# Patient Record
Sex: Female | Born: 1990 | Race: Black or African American | Hispanic: No | Marital: Single | Smoking: Never smoker
Health system: Southern US, Community
[De-identification: ages and names within clinical notes are randomized; demographics above are authoritative.]

## PROBLEM LIST (undated history)

## (undated) ENCOUNTER — Inpatient Hospital Stay (HOSPITAL_COMMUNITY): Payer: Self-pay

## (undated) DIAGNOSIS — L509 Urticaria, unspecified: Secondary | ICD-10-CM

## (undated) DIAGNOSIS — B009 Herpesviral infection, unspecified: Secondary | ICD-10-CM

## (undated) DIAGNOSIS — F32A Depression, unspecified: Secondary | ICD-10-CM

## (undated) DIAGNOSIS — R519 Headache, unspecified: Secondary | ICD-10-CM

## (undated) DIAGNOSIS — R51 Headache: Secondary | ICD-10-CM

## (undated) DIAGNOSIS — B999 Unspecified infectious disease: Secondary | ICD-10-CM

## (undated) DIAGNOSIS — N946 Dysmenorrhea, unspecified: Secondary | ICD-10-CM

## (undated) DIAGNOSIS — T783XXA Angioneurotic edema, initial encounter: Secondary | ICD-10-CM

## (undated) DIAGNOSIS — A64 Unspecified sexually transmitted disease: Secondary | ICD-10-CM

## (undated) DIAGNOSIS — F329 Major depressive disorder, single episode, unspecified: Secondary | ICD-10-CM

## (undated) HISTORY — DX: Unspecified sexually transmitted disease: A64

## (undated) HISTORY — DX: Angioneurotic edema, initial encounter: T78.3XXA

## (undated) HISTORY — DX: Dysmenorrhea, unspecified: N94.6

## (undated) HISTORY — DX: Urticaria, unspecified: L50.9

## (undated) HISTORY — DX: Depression, unspecified: F32.A

## (undated) HISTORY — PX: MULTIPLE TOOTH EXTRACTIONS: SHX2053

---
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
## 2014-04-09 ENCOUNTER — Encounter (HOSPITAL_COMMUNITY): Payer: Self-pay | Admitting: Emergency Medicine

## 2014-04-09 ENCOUNTER — Emergency Department (HOSPITAL_COMMUNITY): Payer: Self-pay

## 2014-04-09 ENCOUNTER — Emergency Department (HOSPITAL_COMMUNITY)
Admission: EM | Admit: 2014-04-09 | Discharge: 2014-04-09 | Disposition: A | Discharge status: Home / Self Care | Payer: Self-pay | Attending: Emergency Medicine | Admitting: Emergency Medicine

## 2014-04-09 DIAGNOSIS — R0789 Other chest pain: Secondary | ICD-10-CM | POA: Insufficient documentation

## 2014-04-09 DIAGNOSIS — R079 Chest pain, unspecified: Secondary | ICD-10-CM

## 2014-04-09 DIAGNOSIS — R5381 Other malaise: Secondary | ICD-10-CM | POA: Insufficient documentation

## 2014-04-09 DIAGNOSIS — Z8679 Personal history of other diseases of the circulatory system: Secondary | ICD-10-CM | POA: Insufficient documentation

## 2014-04-09 DIAGNOSIS — R5383 Other fatigue: Secondary | ICD-10-CM

## 2014-04-09 LAB — CBC WITH DIFFERENTIAL/PLATELET
BASOS ABS: 0.1 10*3/uL (ref 0.0–0.1)
BASOS PCT: 1 % (ref 0–1)
Eosinophils Absolute: 0.6 10*3/uL (ref 0.0–0.7)
Eosinophils Relative: 6 % — ABNORMAL HIGH (ref 0–5)
HCT: 40.1 % (ref 36.0–46.0)
Hemoglobin: 13.4 g/dL (ref 12.0–15.0)
LYMPHS PCT: 42 % (ref 12–46)
Lymphs Abs: 3.7 10*3/uL (ref 0.7–4.0)
MCH: 29.8 pg (ref 26.0–34.0)
MCHC: 33.4 g/dL (ref 30.0–36.0)
MCV: 89.3 fL (ref 78.0–100.0)
Monocytes Absolute: 0.7 10*3/uL (ref 0.1–1.0)
Monocytes Relative: 8 % (ref 3–12)
NEUTROS ABS: 3.8 10*3/uL (ref 1.7–7.7)
NEUTROS PCT: 43 % (ref 43–77)
PLATELETS: 341 10*3/uL (ref 150–400)
RBC: 4.49 MIL/uL (ref 3.87–5.11)
RDW: 13 % (ref 11.5–15.5)
WBC: 8.8 10*3/uL (ref 4.0–10.5)

## 2014-04-09 LAB — I-STAT TROPONIN, ED: Troponin i, poc: 0 ng/mL (ref 0.00–0.08)

## 2014-04-09 LAB — I-STAT CHEM 8, ED
BUN: 11 mg/dL (ref 6–23)
CALCIUM ION: 1.16 mmol/L (ref 1.12–1.23)
Chloride: 107 mEq/L (ref 96–112)
Creatinine, Ser: 0.7 mg/dL (ref 0.50–1.10)
GLUCOSE: 91 mg/dL (ref 70–99)
HEMATOCRIT: 42 % (ref 36.0–46.0)
HEMOGLOBIN: 14.3 g/dL (ref 12.0–15.0)
Potassium: 3.8 mEq/L (ref 3.7–5.3)
Sodium: 135 mEq/L — ABNORMAL LOW (ref 137–147)
TCO2: 24 mmol/L (ref 0–100)

## 2014-04-09 LAB — D-DIMER, QUANTITATIVE (NOT AT ARMC)

## 2014-04-09 NOTE — ED Notes (Signed)
Pt states she has been having chest pain X 1 week to her L chest. No radiation. It hurts her when she presses on her chest and when she takes a deep breath. States she has a new birth control implant since April and that she only drinks sodas because she hates water".

## 2014-04-09 NOTE — ED Notes (Signed)
Pt in c/o chest pain that is intermittent, symptoms x1 week, pain to left breast area, denies n/v or shortness of breath, pain is worse when taking a deep breath, nasal congestion also

## 2014-04-09 NOTE — ED Provider Notes (Signed)
CSN: [id number]     Arrival date & time 04/09/14  1808 History   First MD Initiated Contact with Patient 04/09/14 2206     Chief Complaint  Patient presents with  . Chest Pain     (Consider location/radiation/quality/duration/timing/severity/associated sxs/prior Treatment) HPI Comments: This is a 23 year old who, reports, that she's been having stabbing, left-sided chest pain, intermittently/sporadically for the past, week.  It will suddenly stab her last several seconds and then go away spontaneously.  She denies any nausea, vomiting, diaphoresis, shortness of breath with this pain. She recently had an Implanon birth control implant in April that she does have a very strong history of early cardiac disease.  Patient is a 23 y.o. female presenting with chest pain. The history is provided by the patient.  Chest Pain Pain location:  L chest Pain quality: stabbing   Pain radiates to:  Does not radiate Pain radiates to the back: no   Pain severity:  Mild Onset quality:  Gradual Duration:  1 week Timing:  Sporadic Progression:  Unchanged Chronicity:  New Context: at rest   Context: not breathing, not eating, no intercourse, not lifting, no movement, not raising an arm and no trauma   Relieved by:  Nothing Worsened by:  Nothing tried Ineffective treatments:  None tried Associated symptoms: anxiety and fatigue   Associated symptoms: no back pain, no dizziness, no fever, no heartburn, no nausea, no numbness, no palpitations, no shortness of breath, not vomiting and no weakness   Risk factors: birth control   Risk factors: no aortic disease, no coronary artery disease, no diabetes mellitus, no Ehlers-Danlos syndrome, no high cholesterol, no hypertension, no immobilization, not female, no Marfan's syndrome, not obese, not pregnant, no prior DVT/PE, no smoking and no surgery     History reviewed. No pertinent past medical history. History reviewed. No pertinent past surgical history. History  reviewed. No pertinent family history. History  Substance Use Topics  . Smoking status: Never Smoker   . Smokeless tobacco: Not on file  . Alcohol Use: Not on file   OB History   Grav Para Term Preterm Abortions TAB SAB Ect Mult Living                 Review of Systems  Constitutional: Positive for fatigue. Negative for fever.  Respiratory: Negative for shortness of breath.   Cardiovascular: Positive for chest pain. Negative for palpitations and leg swelling.  Gastrointestinal: Negative for heartburn, nausea and vomiting.  Musculoskeletal: Negative for back pain and joint swelling.  Skin: Negative for rash and wound.  Neurological: Negative for dizziness, weakness and numbness.  All other systems reviewed and are negative.     Allergies  Review of patient's allergies indicates no known allergies.  Home Medications   Prior to Admission medications   Medication Sig Start Date End Date Taking? Authorizing Provider  etonogestrel (IMPLANON) 68 MG IMPL implant Inject 1 each into the skin once.   Yes Historical Provider, MD   BP 103/80  Pulse 101  Temp(Src) 98.1 F (36.7 C) (Oral)  Resp 11  Wt 117 lb (53.071 kg)  SpO2 83%  LMP 03/11/2014 Physical Exam  ED Course  Procedures (including critical care time) Labs Review Labs Reviewed  CBC WITH DIFFERENTIAL - Abnormal; Notable for the following:    Eosinophils Relative 6 (*)    All other components within normal limits  I-STAT CHEM 8, ED - Abnormal; Notable for the following:    Sodium 135 (*)  All other components within normal limits  D-DIMER, QUANTITATIVE  I-STAT TROPOININ, ED    Imaging Review Dg Chest 2 View  04/09/2014   CLINICAL DATA:  Chest pain.  EXAM: CHEST  2 VIEW  COMPARISON:  None.  FINDINGS: The heart size and mediastinal contours are within normal limits. Both lungs are clear. The visualized skeletal structures are unremarkable.  IMPRESSION: No active cardiopulmonary disease.   Electronically Signed    By: Charlett NoseKevin  Dover M.D.   On: 04/09/2014 19:28     EKG Interpretation None     ED ECG REPORT   Date: 04/09/2014  EKG Time: 10:55 PM  Rate: 83  Rhythm: sinus arrhythmia,  there are no previous tracings available for comparison  Axis: normal  Intervals:none  ST&T Change: normal  Narrative Interpretation: normal            MDM  Will obtain a CBC, and a d-dimer Troponin is reassuringly 0.  EKG, is normal sinus rhythm Chest x-ray, and d-dimer are normal she is not in a she's been discharged home with atypical chest pain.  Diagnosis and referral list to help her find a primary care physician Final diagnoses:  Chest pain         Arman FilterGail K Ariell Gunnels, NP 04/09/14 2255

## 2014-04-09 NOTE — Discharge Instructions (Signed)
Chest Pain (Nonspecific) Chest pain has many causes. Your pain could be caused by something serious, such as a heart attack or a blood clot in the lungs. It could also be caused by something less serious, such as a chest bruise or a virus. Follow up with your doctor. More lab tests or other studies may be needed to find the cause of your pain. Most of the time, nonspecific chest pain will improve within 2 to 3 days of rest and mild pain medicine. HOME CARE  For chest bruises, you may put ice on the sore area for 15-20 minutes, 03-04 times a day. Do this only if it makes you feel better.  Put ice in a plastic bag.  Place a towel between the skin and the bag.  Rest for the next 2 to 3 days.  Go back to work if the pain improves.  See your doctor if the pain lasts longer than 1 to 2 weeks.  Only take medicine as told by your doctor.  Quit smoking if you smoke. GET HELP RIGHT AWAY IF:   There is more pain or pain that spreads to the arm, neck, jaw, back, or belly (abdomen).  You have shortness of breath.  You cough more than usual or cough up blood.  You have very bad back or belly pain, feel sick to your stomach (nauseous), or throw up (vomit).  You have very bad weakness.  You pass out (faint).  You have a fever. Any of these problems may be serious and may be an emergency. Do not wait to see if the problems will go away. Get medical help right away. Call your local emergency services 911 in [place] Do not drive yourself to the hospital. MAKE SURE YOU:   Understand these instructions.  Will watch this condition.  Will get help right away if you or your child is not doing well or gets worse. Document Released: 03/30/2008 Document Revised: 01/04/2012 Document Reviewed: 03/30/2008 [facility] Patient Information 2014 [place], [place]. Today, your evaluation, shows that, you are not anemic, you do not have any pneumonia, or any abnormal pathology.  On your chest x-ray, your cardiac  marker is 0, your d-dimer, does not reveal that you have potential for blood clot in her lung and your EKG, is normal.  Exact cause for your symptoms are unknown at this time.  Please make every effort to find a primary care physician.  Return anytime, you become concerned develop new or worsening symptoms  Emergency Department Resource Guide 1) Find a Doctor and Pay Out of Pocket Although you won't have to find out who is covered by your insurance plan, it is a good idea to ask around and get recommendations. You will then need to call the office and see if the doctor you have chosen will accept you as a new patient and what types of options they offer for patients who are self-pay. Some doctors offer discounts or will set up payment plans for their patients who do not have insurance, but you will need to ask so you aren't surprised when you get to your appointment.  2) Contact Your Local Health Department Not all health departments have doctors that can see patients for sick visits, but many do, so it is worth a call to see if yours does. If you don't know where your local health department is, you can check in your phone book. The CDC also has a tool to help you locate your state's health department,  and many state websites also have listings of all of their local health departments.  3) Find a Walk-in Clinic If your illness is not likely to be very severe or complicated, you may want to try a walk in clinic. These are popping up all over the country in pharmacies, drugstores, and shopping centers. They're usually staffed by nurse practitioners or physician assistants that have been trained to treat common illnesses and complaints. They're usually fairly quick and inexpensive. However, if you have serious medical issues or chronic medical problems, these are probably not your best option.  No Primary Care Doctor: - Call Health Connect at  [phone] - they can help you locate a primary care doctor that   accepts your insurance, provides certain services, etc. - Physician Referral Service- [phone]  Chronic Pain Problems: Organization         Address  Phone   Notes  [facility]  [phone] Patients need to be referred by their primary care doctor.   Medication Assistance: Organization         Address  Phone   Notes  [facility] Medication [facility] [address] [place], [place] [address] [phone] --Must be a resident of [facility] -- Must have NO insurance coverage whatsoever (no Medicaid/ Medicare, etc.) -- The pt. MUST have a primary care doctor that directs their care regularly and follows them in the community   MedAssist  [phone]   [employer]  [phone]    Agencies that provide inexpensive medical care: Organization         Address  Phone   Notes  [facility]  [phone]   [facility]    [phone]   [facility] [address] [place], [place] [address] [phone]   [facility] [address] [place] [place] [phone]   Planned Parenthood    [phone]   [facility]    [phone]   [facility]  [address], [place] Phone:  [phone], Fax:  [phone] Hours of Operation:  9 am - 6 pm, M-F.  Also accepts Medicaid/Medicare and self-pay.  [facility]  [address], [place] Phone: [phone], Fax: [phone]. Hours of Operation:  8:30 am - 5:30 pm, M-F.  Also accepts Medicaid and self-pay.  [facility] [place] [address], [place] Phone: [phone]   [facility] [address] Natasha Bence [place], [place] [phone], Ext. [phone] Mondays & Thursdays: 7-9 AM.  First 15 patients are seen on a first come, first serve basis.    Medicaid-accepting [facility] Providers:  Organization          Address  Phone   Notes  [facility] [address], [place] [phone] Also accepts self-pay patients.  [facility] [address] Laurell Josephs [place], [place]  [phone]   [facility] [address], [place] [phone]   [facility] [address], [place] [phone]   Renaye Rakers [address], [place]   [phone] Only accepts [place] patients after they have their name applied to their card.   Self-Pay (no insurance) in [facility]:  Organization  Address  Phone   Notes  Sickle Cell Patients, [facility] [address] [place], [place] [phone]   [facility] [address] [place], [place] [phone]   [facility] [place]  [address], [place] [phone]   [facility]/Dr. Osei-Bonsu  [address], [place] or [address], [place] [phone] Phone number for both [place] and [place] locations is the same.  [facility] [address] [place] [phone]   [facility] [address] [place] or [address] [phone]   [facility] [address] [place] [phone], phone; [phone], fax Sees patients 1st and 3rd Saturday of every month.  Must not qualify for public or private insurance (i.e. Medicaid, Medicare, [place] Health Choice, Veterans' Benefits)  Household income should be no more than 200% of the poverty level The clinic cannot treat you if you are pregnant or think you are pregnant  Sexually transmitted diseases are not treated at the clinic.    Dental Care: Organization         Address  Phone  Notes  [facility] of [facility] [address] [place],  [place] [phone] Accepts children up to age 43 who are enrolled in [place] or [place] Health Choice; pregnant women with a Medicaid card; and children who have applied for Medicaid or [place] Health Choice, but were declined, whose parents can pay a reduced fee at time of service.  [facility]  [address] [place] [phone] Accepts children up to age 24 who are enrolled in [place] or [place] Health Choice; pregnant women with a Medicaid card; and children who have applied for Medicaid or  Health Choice, but were declined, whose parents can pay a reduced fee at time of service.  [facility]  [address] [place], [place] [phone] Patients are seen by appointment only. Walk-ins are not accepted. [facility] will see patients 47 years of age and older. Monday - Tuesday (8am-5pm) Most Wednesdays (8:30-5pm) $30 per visit, cash only  [facility]  [address], [facility] [phone] Patients are seen by appointment only. Walk-ins are not accepted. [facility] will see patients 43 years of age and older. One Wednesday Evening (Monthly: Volunteer Based).  $30 per visit, cash only  [employer]  [phone] for adults; Children under age 79, call Graduate Pediatric Dentistry at [phone]. Children aged 51-14, please call [phone] to request a pediatric application.  Dental services are provided in all areas of dental care including fillings, crowns and bridges, complete and partial dentures, implants, gum treatment, root canals, and extractions. Preventive care is also provided. Treatment is provided to both adults and children. Patients are selected via a lottery and there is often a waiting list.   [facility] [address], [place]  [phone] [email or web address]   [facility] [address] [place], [place]  [phone], Ext. [phone] Second and Fourth Thursday of each month, opens at 6:30 AM; Clinic ends at 9 AM.  Patients are seen on a first-come first-served basis, and a limited number are seen during each clinic.   [facility]  [address] [place], [place]  [place], [place] [phone]   Eligibility Requirements You must have lived in [place], [place], or [place] counties for at least the last three months.   You cannot be eligible for state or federal sponsored National Cityhealthcare insurance, including CIGNAVeterans Administration, [place]icaid, or [employer]icare.   You generally cannot be eligible for healthcare insurance through your employer.    How to apply: Eligibility screenings are held every Tuesday and Wednesday afternoon from 1:00 pm until 4:00 pm. You do not need an appointment for the interview!  [facility] [address], [place], [place] [phone]   [facility]  [phone]   [facility]  [phone]   [facility]  [phone]    Behavioral Health Resources in the Community: Intensive Outpatient Programs Organization         Address  Phone  Notes  [facility] [address], [place], [place] [phone]   [facility] [address], [place], [place] [phone]   [facility] [address], [place], [place]  [phone]   [facility] [address],  [place], [place] [phone] or [phone]   Substance Abuse Resources Organization         Address  Phone  Notes  [facility]  [phone]   [facility]  [phone]   [facility]  [phone]   [facility]  [phone]   Residential & Outpatient Substance Abuse Program  [phone]   Psychological Services Organization         Address  Phone  Notes  [facility]  [phone]- [phone]   [facility]  [phone]    [facility] [address], [place] [phone] or [phone]    Mobile Crisis Teams Organization         Address  Phone  Notes  [facility], Mobile Crisis Care Unit  [phone]   [facility]  [address] [place], [place] [phone]   Doristine LocksSharon DeEsch [address] [place] [phone]    Self-Help/Support Groups Organization         Address  Phone             Notes  [facility] [place] - variety of support groups  [phone] Call for more information  Narcotics Anonymous (NA), [facility] [address], [place]  2 meetings at this location   Statisticianesidential Treatment Programs Organization         Address  Phone  Notes  [facility] [address],    [place]  [phone]   [facility]  [address], [place] [phone], [place], [place] [phone]   [facility] [address], [place] [phone] Admissions: 8am-3pm M-F  [facility] [address]    [place], [place] [phone]   [facility] [address], [place], [place] [phone]   [facility] [address]  [place], [place] [phone]   [facility] - Intensive Outpatient [address], [place], [place] [phone]   [facility] ([facility] [address]  [place], [place] [phone] or [phone]   [facility] ([facility]) [address] [place], [place] [phone] Accepts Medicaid  [facility] [address]  [place] [phone] Substance Abuse/Addiction Treatment   [facility] Resources Organization  Address  Phone  Notes  [facility]  [phone]   Angie FavaJulie Brannon, PhD [address], [address] [place], [place]   [phone] or [phone]   [facility]   [address] [place], [place] [phone]   [facility] [address] [place] [address], [place], [place] [phone] Insurance/Medicaid/sponsorship through [facility]  Faith [facility] Families [address]                                    [place], [place] [phone] Therapy/tele-psych/case  [facility] [address]   [place], [place] [phone]    Dr. Lolly MustacheArfeen  [phone]   [facility]  [employer] [facility] 1) [address], [place] 2) [address], [place] 3)  [address], [place] [phone]  [phone]   [facility] Child Abuse Hotline [phone] or [phone] (After Hours)

## 2014-04-12 NOTE — ED Provider Notes (Signed)
Medical screening examination/treatment/procedure(s) were performed by non-physician practitioner and as supervising physician I was immediately available for consultation/collaboration.    Madelline Eshbach L Renesmae Donahey, MD 04/12/14 1112 

## 2015-10-27 NOTE — L&D Delivery Note (Signed)
Delivery Note  SVD viable female Apgars 8,9 over intact perineum.  Placenta delivered spontaneously intact with 3VC. Good support and hemostasis noted and R/V exam confirms.  PH art was sent. Trailing membranes removed from uterus with ring forceps..  Mother and baby were doing well.  EBL 350cc  Called back for recurrent vaginal bleeding.  Banjo current with minimal tissue.  MEU without retained products.  Bleeding from lower cervical area, without lacerations noted.  Methergine 0.2mg  IM given  Candice Campavid Rigdon Macomber, MD

## 2016-03-24 LAB — OB RESULTS CONSOLE RUBELLA ANTIBODY, IGM: Rubella: IMMUNE

## 2016-03-24 LAB — OB RESULTS CONSOLE ABO/RH: RH Type: POSITIVE

## 2016-03-24 LAB — OB RESULTS CONSOLE GC/CHLAMYDIA
CHLAMYDIA, DNA PROBE: NEGATIVE
GC PROBE AMP, GENITAL: NEGATIVE

## 2016-03-24 LAB — OB RESULTS CONSOLE RPR: RPR: NONREACTIVE

## 2016-03-24 LAB — OB RESULTS CONSOLE HEPATITIS B SURFACE ANTIGEN: HEP B S AG: NEGATIVE

## 2016-03-24 LAB — OB RESULTS CONSOLE HIV ANTIBODY (ROUTINE TESTING): HIV: NONREACTIVE

## 2016-03-24 LAB — OB RESULTS CONSOLE ANTIBODY SCREEN: ANTIBODY SCREEN: NEGATIVE

## 2016-05-11 ENCOUNTER — Emergency Department (HOSPITAL_COMMUNITY): Payer: 59

## 2016-05-11 ENCOUNTER — Encounter (HOSPITAL_COMMUNITY): Payer: Self-pay | Admitting: Emergency Medicine

## 2016-05-11 ENCOUNTER — Emergency Department (HOSPITAL_COMMUNITY)
Admission: EM | Admit: 2016-05-11 | Discharge: 2016-05-11 | Disposition: A | Discharge status: Home / Self Care | Payer: 59 | Attending: Emergency Medicine | Admitting: Emergency Medicine

## 2016-05-11 ENCOUNTER — Emergency Department (HOSPITAL_BASED_OUTPATIENT_CLINIC_OR_DEPARTMENT_OTHER): Admit: 2016-05-11 | Discharge: 2016-05-11 | Disposition: A | Discharge status: Home / Self Care | Payer: 59

## 2016-05-11 DIAGNOSIS — R0602 Shortness of breath: Secondary | ICD-10-CM | POA: Diagnosis not present

## 2016-05-11 DIAGNOSIS — R0789 Other chest pain: Secondary | ICD-10-CM

## 2016-05-11 DIAGNOSIS — O26892 Other specified pregnancy related conditions, second trimester: Secondary | ICD-10-CM | POA: Diagnosis present

## 2016-05-11 DIAGNOSIS — Z3A17 17 weeks gestation of pregnancy: Secondary | ICD-10-CM | POA: Insufficient documentation

## 2016-05-11 LAB — CBC
HCT: 33 % — ABNORMAL LOW (ref 36.0–46.0)
HEMOGLOBIN: 11 g/dL — AB (ref 12.0–15.0)
MCH: 28.8 pg (ref 26.0–34.0)
MCHC: 33.3 g/dL (ref 30.0–36.0)
MCV: 86.4 fL (ref 78.0–100.0)
PLATELETS: 303 10*3/uL (ref 150–400)
RBC: 3.82 MIL/uL — AB (ref 3.87–5.11)
RDW: 12.7 % (ref 11.5–15.5)
WBC: 8.6 10*3/uL (ref 4.0–10.5)

## 2016-05-11 LAB — BASIC METABOLIC PANEL
ANION GAP: 6 (ref 5–15)
BUN: <5 mg/dL — ABNORMAL LOW (ref 6–20)
CO2: 19 mmol/L — AB (ref 22–32)
Calcium: 9.1 mg/dL (ref 8.9–10.3)
Chloride: 108 mmol/L (ref 101–111)
Creatinine, Ser: 0.52 mg/dL (ref 0.44–1.00)
GLUCOSE: 107 mg/dL — AB (ref 65–99)
Potassium: 3.3 mmol/L — ABNORMAL LOW (ref 3.5–5.1)
Sodium: 133 mmol/L — ABNORMAL LOW (ref 135–145)

## 2016-05-11 LAB — D-DIMER, QUANTITATIVE: D-Dimer, Quant: 1.45 ug/mL-FEU — ABNORMAL HIGH (ref 0.00–0.50)

## 2016-05-11 LAB — I-STAT TROPONIN, ED: Troponin i, poc: 0 ng/mL (ref 0.00–0.08)

## 2016-05-11 MED ORDER — IOPAMIDOL (ISOVUE-370) INJECTION 76%
INTRAVENOUS | Status: AC
  Administered 2016-05-11: 100 mL
  Filled 2016-05-11: qty 100

## 2016-05-11 NOTE — Progress Notes (Signed)
Preliminary results by tech - Venous Duplex Lower Ext. Completed. Negative for deep and superficial vein thrombosis in both legs.  Shaune Westfall, BS, RDMS, RVT  

## 2016-05-11 NOTE — ED Notes (Signed)
Pt here with CP, SOB since 2 weeks ago., Pt reports that she believes that it is anxiety related but she wanted to be checked out since she is [redacted] weeks pregnant.

## 2016-05-11 NOTE — ED Notes (Signed)
Pt states she understands instructions,home stable with steady gait. 

## 2016-05-11 NOTE — ED Notes (Signed)
MD at bedside. 

## 2016-05-11 NOTE — Discharge Instructions (Signed)
Your CT scan was negative for pulmonary embolism. Your ultrasound was negative for blood clot in your legs. You are very low risk to have your symptoms be related to your heart. Please follow-up with your OB/GYN for further evaluation and treatment of your shortness of breath. Please return to emergency department if you develop any new or worsening symptoms.   Shortness of Breath Shortness of breath means you have trouble breathing. It could also mean that you have a medical problem. You should get immediate medical care for shortness of breath. CAUSES   Not enough oxygen in the air such as with high altitudes or a smoke-filled room.  Certain lung diseases, infections, or problems.  Heart disease or conditions, such as angina or heart failure.  Low red blood cells (anemia).  Poor physical fitness, which can cause shortness of breath when you exercise.  Chest or back injuries or stiffness.  Being overweight.  Smoking.  Anxiety, which can make you feel like you are not getting enough air. DIAGNOSIS  Serious medical problems can often be found during your physical exam. Tests may also be done to determine why you are having shortness of breath. Tests may include:  Chest X-rays.  Lung function tests.  Blood tests.  An electrocardiogram (ECG).  An ambulatory electrocardiogram. An ambulatory ECG records your heartbeat patterns over a 24-hour period.  Exercise testing.  A transthoracic echocardiogram (TTE). During echocardiography, sound waves are used to evaluate how blood flows through your heart.  A transesophageal echocardiogram (TEE).  Imaging scans. Your health care provider may not be able to find a cause for your shortness of breath after your exam. In this case, it is important to have a follow-up exam with your health care provider as directed.  TREATMENT  Treatment for shortness of breath depends on the cause of your symptoms and can vary greatly. HOME CARE  INSTRUCTIONS   Do not smoke. Smoking is a common cause of shortness of breath. If you smoke, ask for help to quit.  Avoid being around chemicals or things that may bother your breathing, such as paint fumes and dust.  Rest as needed. Slowly resume your usual activities.  If medicines were prescribed, take them as directed for the full length of time directed. This includes oxygen and any inhaled medicines.  Keep all follow-up appointments as directed by your health care provider. SEEK MEDICAL CARE IF:   Your condition does not improve in the time expected.  You have a hard time doing your normal activities even with rest.  You have any new symptoms. SEEK IMMEDIATE MEDICAL CARE IF:   Your shortness of breath gets worse.  You feel light-headed, faint, or develop a cough not controlled with medicines.  You start coughing up blood.  You have pain with breathing.  You have chest pain or pain in your arms, shoulders, or abdomen.  You have a fever.  You are unable to walk up stairs or exercise the way you normally do. MAKE SURE YOU:  Understand these instructions.  Will watch your condition.  Will get help right away if you are not doing well or get worse.   This information is not intended to replace advice given to you by your health care provider. Make sure you discuss any questions you have with your health care provider.   Document Released: 07/07/2001 Document Revised: 10/17/2013 Document Reviewed: 12/28/2011 Elsevier Interactive Patient Education [employer]2016 Elsevier Inc.

## 2016-05-11 NOTE — ED Provider Notes (Signed)
CSN: [id number]     Arrival date & time 05/11/16  1120 History   First MD Initiated Contact with Patient 05/11/16 1258     Chief Complaint  Patient presents with  . Chest Pain  . Shortness of Breath     (Consider location/radiation/quality/duration/timing/severity/associated sxs/prior Treatment) HPI Comments: Patient is a [redacted] week pregnant 25 year old female who presents with a two-week history of shortness of breath and chest tightness. Patient reports that her symptoms began after she was on a long car ride (>10 hours) with many smokers to [place]. Patient states she began feeling chest tightness and shortness of breath following the car ride while she was in [place]. Patient continues to have symptoms. Her symptoms are worse with lying down. Patient denies chest pain, however her chest tightness increases when she takes a deep breath and when she was sleeping/laying down. Patient has not tried any medications at home. Patient denies smoking herself. Patient sees Dr. Marcelle Overlie as her OB/GYN and her pregnancy has been going well. Patient has seen her OB/GYN about this problem, however he stated that shortness of breath is common in pregnancy, however she is still experiencing symptoms and is concerned. Patient states that her grandmother and many aunts had an MI, her brother has a congenital heart defect. Patient has also been under a lot of stress lately, as her brother was in a bad car accident 1 week ago and is in the hospital.  Patient is a 25 y.o. female presenting with chest pain and shortness of breath. The history is provided by the patient.  Chest Pain Associated symptoms: shortness of breath   Associated symptoms: no abdominal pain, no back pain, no fever, no headache, no nausea and not vomiting   Shortness of Breath Associated symptoms: no abdominal pain, no chest pain, no fever, no headaches, no rash and no vomiting     History reviewed. No pertinent past medical history. History  reviewed. No pertinent past surgical history. History reviewed. No pertinent family history. Social History  Substance Use Topics  . Smoking status: Never Smoker   . Smokeless tobacco: None  . Alcohol Use: No   OB History    Gravida Para Term Preterm AB TAB SAB Ectopic Multiple Living   1              Review of Systems  Constitutional: Negative for fever and chills.  HENT: Negative for facial swelling.   Respiratory: Positive for chest tightness and shortness of breath.   Cardiovascular: Negative for chest pain.  Gastrointestinal: Negative for nausea, vomiting and abdominal pain.  Genitourinary: Negative for dysuria.  Musculoskeletal: Negative for back pain.  Skin: Negative for rash and wound.  Neurological: Negative for headaches.  Psychiatric/Behavioral: The patient is not nervous/anxious.       Allergies  Review of patient's allergies indicates no known allergies.  Home Medications   Prior to Admission medications   Medication Sig Start Date End Date Taking? Authorizing Provider  Prenatal Vit-Fe Fumarate-FA (PRENATAL PO) Take 1 tablet by mouth daily.   Yes Historical Provider, MD  valACYclovir (VALTREX) 500 MG tablet Take 500 mg by mouth every other day.   Yes Historical Provider, MD   BP 101/61 mmHg  Pulse 83  Temp(Src) 98.7 F (37.1 C) (Oral)  Resp 23  Ht 5\' 5"  (1.651 m)  Wt 59.875 kg  BMI 21.97 kg/m2  SpO2 95% Physical Exam  Constitutional: She appears well-developed and well-nourished. No distress.  HENT:  Head: Normocephalic and atraumatic.  Mouth/Throat: Oropharynx is clear and moist. No oropharyngeal exudate.  Eyes: Conjunctivae are normal. Pupils are equal, round, and reactive to light. Right eye exhibits no discharge. Left eye exhibits no discharge. No scleral icterus.  Neck: Normal range of motion. Neck supple. No thyromegaly present.  Cardiovascular: Normal rate, regular rhythm, normal heart sounds and intact distal pulses.  Exam reveals no gallop  and no friction rub.   No murmur heard. Pulmonary/Chest: Effort normal and breath sounds normal. No stridor. No respiratory distress. She has no wheezes. She has no rales. She exhibits no tenderness.  Abdominal: Soft. Bowel sounds are normal. She exhibits no distension. There is no tenderness. There is no rebound and no guarding.  Musculoskeletal: She exhibits no edema.  Lymphadenopathy:    She has no cervical adenopathy.  Neurological: She is alert. Coordination normal.  Skin: Skin is warm and dry. No rash noted. She is not diaphoretic. No pallor.  Psychiatric: She has a normal mood and affect.  Nursing note and vitals reviewed.   ED Course  Procedures (including critical care time) Labs Review Labs Reviewed  BASIC METABOLIC PANEL - Abnormal; Notable for the following:    Sodium 133 (*)    Potassium 3.3 (*)    CO2 19 (*)    Glucose, Bld 107 (*)    BUN <5 (*)    All other components within normal limits  CBC - Abnormal; Notable for the following:    RBC 3.82 (*)    Hemoglobin 11.0 (*)    HCT 33.0 (*)    All other components within normal limits  D-DIMER, QUANTITATIVE (NOT AT [facility]) - Abnormal; Notable for the following:    D-Dimer, Quant 1.45 (*)    All other components within normal limits  I-STAT TROPOININ, ED    Imaging Review Ct Angio Chest Pe W/cm &/or Wo Cm  05/11/2016  CLINICAL DATA:  Chest tightness and shortness of breath for 2 weeks status post 10 hour car ride. Elevated D-dimer. Patient is in the second trimester of pregnancy. The ordering provider discussed the risks and benefits of obtaining a chest CT angiogram with the patient prior to the study. EXAM: CT ANGIOGRAPHY CHEST WITH CONTRAST TECHNIQUE: Multidetector CT imaging of the chest was performed using the standard protocol during bolus administration of intravenous contrast. Multiplanar CT image reconstructions and MIPs were obtained to evaluate the vascular anatomy. CONTRAST:  70 cc Isovue 370 IV. COMPARISON:   04/09/2014 chest radiograph. FINDINGS: Mediastinum/Nodes: The study is high quality for the evaluation of pulmonary embolism. There are no filling defects in the central, lobar, segmental or subsegmental pulmonary artery branches to suggest acute pulmonary embolism. Great vessels are normal in course and caliber. Normal heart size. No significant pericardial fluid/thickening. No discrete thyroid nodules. Unremarkable esophagus. No pathologically enlarged axillary, mediastinal or hilar lymph nodes. Lungs/Pleura: No pneumothorax. No pleural effusion. No acute consolidative airspace disease, significant pulmonary nodules or lung masses. Upper abdomen: Unremarkable. Musculoskeletal:  No aggressive appearing focal osseous lesions. Review of the MIP images confirms the above findings. IMPRESSION: No pulmonary embolism.  No active disease in the chest. Electronically Signed   By: Delbert Phenix M.D.   On: 05/11/2016 17:01   I have personally reviewed and evaluated these images and lab results as part of my medical decision-making.   EKG Interpretation   Date/Time:  Monday May 11 2016 11:33:01 EDT Ventricular Rate:  108 PR Interval:  124 QRS Duration: 68 QT Interval:  312 QTC Calculation: 418 R Axis:  81 Text Interpretation:  Sinus tachycardia Otherwise normal ECG agree  Confirmed by Donnald GarrePfeiffer, MD, Lebron ConnersMarcy [phone]([id number]) on 05/11/2016 2:08:52 PM      Prior to CT angio, I discussed in depth with patient the risks and benefits regarding the study in pregnancy. Patient confirmed twice in two separate conversations that she would like the study done despite the risk of radiation causing birth defects to her unborn child.  MDM   Patient with 2 weeks of shortness of breath and chest tightness. [redacted] weeks pregnant. Lung exam clear without wheezing. EKG shows sinus tachy. CBC shows Hgb 11.0. BMP shows Na 133, K 3.3, CO2 19, glucose 107, BUN <5. Troponin 0.0. D-Dimer 1.45. CT angio shows no PE or active disease in chest.  Discussed risks and benefits of study as described above. Oxygen sats >95% in ED. Patient with long artifical nails causing waveform to be inconsistent explaining drops in O2 throughout ED course. Venous ultrasound of Bilateral LEs negative for DVT (ordered simultaneously with D-dimer in hopes to avoid CT scan in pregnant female). Patient symptoms moderately improved in ED. She states it may because she was just sitting and not very stressed. Shortness of breath could also be due to pregnancy as suggested by OBGYN. [facility]. Strict return precautions discussed. Patient understands and agrees with plan.  Discussed patient with Dr. Donnald GarrePfeiffer who is in agreement with plan. Patient discharged with stable vitals and in satisfactory condition.   Final diagnoses:  Shortness of breath  Chest tightness       Emi Holeslexandra M Josefita Weissmann, Cordelia Poche-C 05/11/16 2111   Arby BarretteMarcy Pfeiffer, MD 05/26/16 1527

## 2016-08-31 ENCOUNTER — Encounter (HOSPITAL_COMMUNITY): Payer: Self-pay | Admitting: Emergency Medicine

## 2016-08-31 ENCOUNTER — Inpatient Hospital Stay (HOSPITAL_COMMUNITY): Payer: 59

## 2016-08-31 ENCOUNTER — Inpatient Hospital Stay (HOSPITAL_COMMUNITY)
Admission: EM | Admit: 2016-08-31 | Discharge: 2016-09-04 | DRG: 782 | Disposition: A | Discharge status: Home / Self Care | Payer: 59 | Attending: Obstetrics and Gynecology | Admitting: Obstetrics and Gynecology

## 2016-08-31 DIAGNOSIS — N858 Other specified noninflammatory disorders of uterus: Secondary | ICD-10-CM | POA: Diagnosis present

## 2016-08-31 DIAGNOSIS — B009 Herpesviral infection, unspecified: Secondary | ICD-10-CM | POA: Diagnosis present

## 2016-08-31 DIAGNOSIS — R1084 Generalized abdominal pain: Secondary | ICD-10-CM

## 2016-08-31 DIAGNOSIS — Z3A33 33 weeks gestation of pregnancy: Secondary | ICD-10-CM | POA: Diagnosis not present

## 2016-08-31 DIAGNOSIS — Z362 Encounter for other antenatal screening follow-up: Secondary | ICD-10-CM

## 2016-08-31 DIAGNOSIS — N898 Other specified noninflammatory disorders of vagina: Secondary | ICD-10-CM | POA: Diagnosis present

## 2016-08-31 DIAGNOSIS — O3433 Maternal care for cervical incompetence, third trimester: Secondary | ICD-10-CM

## 2016-08-31 HISTORY — DX: Headache, unspecified: R51.9

## 2016-08-31 HISTORY — DX: Herpesviral infection, unspecified: B00.9

## 2016-08-31 HISTORY — DX: Unspecified infectious disease: B99.9

## 2016-08-31 HISTORY — DX: Headache: R51

## 2016-08-31 LAB — COMPREHENSIVE METABOLIC PANEL
ALT: 18 U/L (ref 14–54)
ANION GAP: 8 (ref 5–15)
AST: 26 U/L (ref 15–41)
Albumin: 2.8 g/dL — ABNORMAL LOW (ref 3.5–5.0)
Alkaline Phosphatase: 73 U/L (ref 38–126)
BILIRUBIN TOTAL: 0.2 mg/dL — AB (ref 0.3–1.2)
BUN: <5 mg/dL — ABNORMAL LOW (ref 6–20)
CALCIUM: 8.5 mg/dL — AB (ref 8.9–10.3)
CO2: 22 mmol/L (ref 22–32)
Chloride: 104 mmol/L (ref 101–111)
Creatinine, Ser: 0.62 mg/dL (ref 0.44–1.00)
GFR calc Af Amer: >60 mL/min (ref >60)
Glucose, Bld: 130 mg/dL — ABNORMAL HIGH (ref 65–99)
POTASSIUM: 3.4 mmol/L — AB (ref 3.5–5.1)
Sodium: 134 mmol/L — ABNORMAL LOW (ref 135–145)
TOTAL PROTEIN: 5.8 g/dL — AB (ref 6.5–8.1)

## 2016-08-31 LAB — URINALYSIS, ROUTINE W REFLEX MICROSCOPIC
Bilirubin Urine: NEGATIVE
GLUCOSE, UA: NEGATIVE mg/dL
Hgb urine dipstick: NEGATIVE
Ketones, ur: NEGATIVE mg/dL
NITRITE: NEGATIVE
Protein, ur: NEGATIVE mg/dL
Specific Gravity, Urine: 1.011 (ref 1.005–1.030)
pH: 7 (ref 5.0–8.0)

## 2016-08-31 LAB — CBC WITH DIFFERENTIAL/PLATELET
BASOS ABS: 0 10*3/uL (ref 0.0–0.1)
BASOS PCT: 0 %
Eosinophils Absolute: 0.2 10*3/uL (ref 0.0–0.7)
Eosinophils Relative: 2 %
HCT: 29.8 % — ABNORMAL LOW (ref 36.0–46.0)
Hemoglobin: 10 g/dL — ABNORMAL LOW (ref 12.0–15.0)
LYMPHS PCT: 21 %
Lymphs Abs: 2.1 10*3/uL (ref 0.7–4.0)
MCH: 29.2 pg (ref 26.0–34.0)
MCHC: 33.6 g/dL (ref 30.0–36.0)
MCV: 87.1 fL (ref 78.0–100.0)
Monocytes Absolute: 0.7 10*3/uL (ref 0.1–1.0)
Monocytes Relative: 7 %
NEUTROS ABS: 7.1 10*3/uL (ref 1.7–7.7)
Neutrophils Relative %: 70 %
Platelets: 330 10*3/uL (ref 150–400)
RBC: 3.42 MIL/uL — AB (ref 3.87–5.11)
RDW: 13.6 % (ref 11.5–15.5)
WBC: 9.9 10*3/uL (ref 4.0–10.5)

## 2016-08-31 LAB — TYPE AND SCREEN
ABO/RH(D): B POS
ANTIBODY SCREEN: NEGATIVE

## 2016-08-31 LAB — WET PREP, GENITAL
Clue Cells Wet Prep HPF POC: NONE SEEN
Sperm: NONE SEEN
Trich, Wet Prep: NONE SEEN
Yeast Wet Prep HPF POC: NONE SEEN

## 2016-08-31 LAB — URINE MICROSCOPIC-ADD ON

## 2016-08-31 LAB — POCT FERN TEST: POCT Fern Test: NEGATIVE

## 2016-08-31 LAB — AMNISURE RUPTURE OF MEMBRANE (ROM) NOT AT ARMC: Amnisure ROM: NEGATIVE

## 2016-08-31 LAB — ABO/RH: ABO/RH(D): B POS

## 2016-08-31 MED ORDER — PRENATAL MULTIVITAMIN CH
1.0000 | ORAL_TABLET | Freq: Every day | ORAL | Status: DC
  Administered 2016-09-01 – 2016-09-03 (×3): 1 via ORAL
  Filled 2016-08-31 (×5): qty 1

## 2016-08-31 MED ORDER — LACTATED RINGERS IV SOLN
INTRAVENOUS | Status: DC
  Administered 2016-08-31 – 2016-09-03 (×8): via INTRAVENOUS

## 2016-08-31 MED ORDER — MAGNESIUM SULFATE 50 % IJ SOLN
1.0000 g/h | INTRAVENOUS | Status: DC
  Administered 2016-09-01 – 2016-09-02 (×2): 2 g/h via INTRAVENOUS
  Administered 2016-09-03: 2.5 g/h via INTRAVENOUS
  Filled 2016-08-31 (×4): qty 80

## 2016-08-31 MED ORDER — MAGNESIUM SULFATE BOLUS VIA INFUSION
4.0000 g | Freq: Once | INTRAVENOUS | Status: AC
  Administered 2016-08-31: 4 g via INTRAVENOUS
  Filled 2016-08-31: qty 500

## 2016-08-31 MED ORDER — CALCIUM CARBONATE ANTACID 500 MG PO CHEW: 2.0000 | CHEWABLE_TABLET | ORAL | Status: DC | PRN

## 2016-08-31 MED ORDER — ZOLPIDEM TARTRATE 5 MG PO TABS
5.0000 mg | ORAL_TABLET | Freq: Every evening | ORAL | Status: DC | PRN
  Administered 2016-09-03: 5 mg via ORAL
  Filled 2016-08-31: qty 1

## 2016-08-31 MED ORDER — NIFEDIPINE 10 MG PO CAPS
10.0000 mg | ORAL_CAPSULE | Freq: Once | ORAL | Status: AC
  Administered 2016-08-31: 10 mg via ORAL
  Filled 2016-08-31: qty 1

## 2016-08-31 MED ORDER — ACETAMINOPHEN 325 MG PO TABS: 650.0000 mg | ORAL_TABLET | ORAL | Status: DC | PRN

## 2016-08-31 MED ORDER — VALACYCLOVIR HCL 500 MG PO TABS
500.0000 mg | ORAL_TABLET | Freq: Every day | ORAL | Status: DC
Start: 2016-08-31 — End: 2016-09-04
  Administered 2016-08-31 – 2016-09-03 (×4): 500 mg via ORAL
  Filled 2016-08-31 (×4): qty 1

## 2016-08-31 MED ORDER — SODIUM CHLORIDE 0.9 % IV BOLUS (SEPSIS)
1000.0000 mL | Freq: Once | INTRAVENOUS | Status: AC
  Administered 2016-08-31: 1000 mL via INTRAVENOUS

## 2016-08-31 MED ORDER — NIFEDIPINE 10 MG PO CAPS
10.0000 mg | ORAL_CAPSULE | Freq: Four times a day (QID) | ORAL | Status: DC
  Administered 2016-08-31: 10 mg via ORAL
  Filled 2016-08-31: qty 1

## 2016-08-31 MED ORDER — BETAMETHASONE SOD PHOS & ACET 6 (3-3) MG/ML IJ SUSP
12.0000 mg | INTRAMUSCULAR | Status: AC
  Administered 2016-08-31 – 2016-09-01 (×2): 12 mg via INTRAMUSCULAR
  Filled 2016-08-31 (×2): qty 2

## 2016-08-31 MED ORDER — DOCUSATE SODIUM 100 MG PO CAPS
100.0000 mg | ORAL_CAPSULE | Freq: Every day | ORAL | Status: DC
  Administered 2016-09-01 – 2016-09-03 (×3): 100 mg via ORAL
  Filled 2016-08-31 (×5): qty 1

## 2016-08-31 NOTE — MAU Provider Note (Signed)
History     CSN: [id number]  Arrival date and time: 08/31/16 [id number]   First Provider Initiated Contact with Patient 08/31/16 1009      Chief Complaint  Patient presents with  . Abdominal Pain    [redacted] weeks pregnant  . Vaginal Discharge   HPI  Meagan Skinner is a 25 y.o. G2P0010 at 8216w0d. [facility]. She presents with low abd cramps for wks, no recent change, just tired of it. Her discharge is increased- milky white, no odor or itching. She has sharp vaginal pains constantly. She has urinary frequency, no urgency or dysuria. She is nauseated, no other GI changes. She has bil low back aching. Next PNV is tomorrow, Dr Marcelle OverlieHolland is her primary.   OB History    Gravida Para Term Preterm AB Living   2       1 0   SAB TAB Ectopic Multiple Live Births   1              Past Medical History:  Diagnosis Date  . Headache   . Herpes    no outbreaks, +blood test  . Infection    uti    Past Surgical History:  Procedure Laterality Date  . NO PAST SURGERIES      Family History  Problem Relation Age of Onset  . Diabetes Mother   . Diabetes Maternal Grandmother   . Crohn's disease Maternal Grandmother   . Fibromyalgia Maternal Grandmother   . Heart disease Maternal Grandmother   . Asthma Maternal Grandmother   . Stroke Maternal Grandmother   . Cancer Maternal Grandfather     Social History  Substance Use Topics  . Smoking status: Never Smoker  . Smokeless tobacco: Never Used  . Alcohol use No    Allergies: No Known Allergies  Prescriptions Prior to Admission  Medication Sig Dispense Refill Last Dose  . Prenatal Vit-Fe Fumarate-FA (PRENATAL PO) Take 1 tablet by mouth daily.   08/30/2016 at Unknown time  . RaNITidine HCl (ZANTAC PO) Take 1 tablet by mouth daily as needed (for heartburn).   08/30/2016 at Unknown time    Review of Systems  Constitutional: Negative.  Negative for chills and fever.  Genitourinary: Positive for frequency. Negative for  dysuria and urgency.       Milky discharge Low abd cramping  Musculoskeletal: Positive for back pain.   Physical Exam   Blood pressure (!) 107/54, pulse 93, temperature 98.2 F (36.8 C), resp. rate 16, height 5\' 5"  (1.651 m), weight 153 lb (69.4 kg), SpO2 99 %.  Physical Exam  Nursing note and vitals reviewed. Constitutional: She is oriented to person, place, and time. She appears well-developed and well-nourished.  GI: Soft. There is no tenderness.  Genitourinary:  Genitourinary Comments: Pelvic exam: Ext gen- nl anatomy Vagina-mod amt creamy whit discharge Cx-2-3cm, 50 % Uterus- gravid,33 wks, non tender And- non tender   Musculoskeletal: Normal range of motion.  Neurological: She is alert and oriented to person, place, and time.  Skin: Skin is warm and dry.  Psychiatric: She has a normal mood and affect. Her behavior is normal.    MAU Course  Procedures  MDM 33 wks with contractions and cervical change Fern neg, amnisure neg Procardia given, pt not feeling cramping now Reactive strip   Assessment and Plan  Consulted with Dr Henderson Cloudomblin, to admit to antenatal for steroids Pt and her partner informed of plan of care  [place] Bohac, Olegario MessierKATHY M. 08/31/2016, 10:23 AM

## 2016-08-31 NOTE — ED Triage Notes (Signed)
Patient states that she has had abdominal pain and vaginal discharge for weeks.  Complaining of nausea, loss of appetite, and lower abdominal pain.  Patient states she knows it is abnormal and wanted to get checked out.  No pain with urinating.

## 2016-08-31 NOTE — H&P (Signed)
Meagan Skinner is a 25 y.o. female presenting for PTL. [facility] with C/O vaginal discharge. [facility] where she was noted to be contracting and cx=2-3 cm dilated. No ROM (amnisure negative), no vaginal bleeding. Was given procardia po x 2 for irregular UCs. Patient does not feel UCs. 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 No prodromal symptoms. OB History    Gravida Para Term Preterm AB Living   2       1 0   SAB TAB Ectopic Multiple Live Births   0 1     0     Past Medical History:  Diagnosis Date  . Headache   . Herpes    no outbreaks, +blood test  . Infection    uti   Past Surgical History:  Procedure Laterality Date  . NO PAST SURGERIES     Family History: family history includes Asthma in her brother, maternal grandmother, and sister; Cancer in her maternal grandfather; Crohn's disease in her maternal grandmother; Diabetes in her maternal grandmother and mother; Fibromyalgia in her maternal grandmother; Heart disease in her maternal grandmother and sister; Stroke in her maternal grandmother. Social History:  reports that she has never smoked. She has never used smokeless tobacco. She reports that she does not drink alcohol or use drugs.     Maternal Diabetes: No Genetic Screening: Normal Maternal Ultrasounds/Referrals: Normal Fetal Ultrasounds or other Referrals:  None Maternal Substance Abuse:  No Significant Maternal Medications:  Meds include: Other: Valtrex Significant Maternal Lab Results:  None Other Comments:  None  Review of Systems  Eyes: Negative for blurred vision.  Gastrointestinal: Negative for abdominal pain.  Neurological: Negative for headaches.   Maternal Medical History:  Fetal activity: Perceived fetal activity is normal.      Dilation: 2.5 Effacement (%): 70 Station: -2 Exam by:: jolynn/Kathy Harris NP Blood pressure (!) 98/48, pulse (!) 118, temperature 98.2 F (36.8 C), temperature  source Oral, resp. rate 20, height 5\' 5"  (1.651 m), weight 153 lb (69.4 kg), SpO2 99 %. Maternal Exam:  Uterine Assessment: Contraction strength is moderate.  Contraction frequency is regular.   Abdomen: Patient reports no abdominal tenderness. Fetal presentation: vertex     Fetal Exam Fetal State Assessment: Category I - tracings are normal.     Physical Exam  Cardiovascular: Normal rate.   Respiratory: Effort normal.  GI: Soft. There is no tenderness.  Neurological: She has normal reflexes.    FHT category 1 UCs becoming more regular, about q2-3 min, moderate to palpation  U/S vtx, 5# 3 oz, AFI 53%  Prenatal labs: ABO, Rh: --/--/B POS (11/06 1338) Antibody: NEG (11/06 1335) Rubella:   RPR:    HBsAg:    HIV:    GBS:     Assessment/Plan: 25 yo G2P0 @ 33 0/7 weeks with PTL                 Vtx                  BMTZ                  Neonatology consult ordered         HSV +AB on Valtrex suppression, no prodrome       Janel Beane II,Mahayla Haddaway E 08/31/2016, 5:27 PM

## 2016-08-31 NOTE — ED Provider Notes (Signed)
[facility]-EMERGENCY DEPT Provider Note   CSN: [id number] Arrival date & time: 08/31/16  [id number]     History   Chief Complaint Chief Complaint  Patient presents with  . Abdominal Pain    [redacted] weeks pregnant  . Vaginal Discharge    HPI Meagan Skinner is a 25 y.o. female.  Pt is a 7533 week pregnant female who presents to the ED with abdominal and back pain.  She also c/o vaginal pain.  She denies vaginal bleeding.  She does not have the sensation to push.  She does receive OB care with Dr. Marcelle OverlieHolland.      History reviewed. No pertinent past medical history.  There are no active problems to display for this patient.   History reviewed. No pertinent surgical history.  OB History    Gravida Para Term Preterm AB Living   2       1 0   SAB TAB Ectopic Multiple Live Births   1               Home Medications    Prior to Admission medications   Medication Sig Start Date End Date Taking? Authorizing Provider  Prenatal Vit-Fe Fumarate-FA (PRENATAL PO) Take 1 tablet by mouth daily.    Historical Provider, MD  valACYclovir (VALTREX) 500 MG tablet Take 500 mg by mouth every other day.    Historical Provider, MD    Family History History reviewed. No pertinent family history.  Social History Social History  Substance Use Topics  . Smoking status: Never Smoker  . Smokeless tobacco: Never Used  . Alcohol use No     Allergies   Patient has no known allergies.   Review of Systems Review of Systems  Gastrointestinal: Positive for abdominal pain.  Genitourinary: Positive for vaginal pain.  All other systems reviewed and are negative.    Physical Exam Updated Vital Signs BP 122/62 (BP Location: Right Arm)   Pulse 112   Temp 97.6 F (36.4 C) (Oral)   Resp 18   Ht 5\' 5"  (1.651 m)   Wt 153 lb (69.4 kg)   SpO2 100%   BMI 25.46 kg/m   Physical Exam  Constitutional: She appears well-developed and well-nourished.  HENT:  Head: Normocephalic and atraumatic.  Right  Ear: External ear normal.  Left Ear: External ear normal.  Nose: Nose normal.  Mouth/Throat: Oropharynx is clear and moist.  Eyes: Conjunctivae and EOM are normal. Pupils are equal, round, and reactive to light.  Neck: Normal range of motion. Neck supple.  Cardiovascular: Normal rate, regular rhythm, normal heart sounds and intact distal pulses.   Pulmonary/Chest: Effort normal and breath sounds normal.  Abdominal: Soft. Bowel sounds are normal. There is tenderness in the suprapubic area.  Pt is gravid.    Musculoskeletal: Normal range of motion.  Neurological: She is alert.  Skin: Skin is warm.  Psychiatric: She has a normal mood and affect. Her behavior is normal. Judgment and thought content normal.  Nursing note and vitals reviewed.    ED Treatments / Results  Labs (all labs ordered are listed, but only abnormal results are displayed) Labs Reviewed  COMPREHENSIVE METABOLIC PANEL - Abnormal; Notable for the following:       Result Value   Sodium 134 (*)    Potassium 3.4 (*)    Glucose, Bld 130 (*)    BUN <5 (*)    Calcium 8.5 (*)    Total Protein 5.8 (*)    Albumin 2.8 (*)  Total Bilirubin 0.2 (*)    All other components within normal limits  CBC WITH DIFFERENTIAL/PLATELET - Abnormal; Notable for the following:    RBC 3.42 (*)    Hemoglobin 10.0 (*)    HCT 29.8 (*)    All other components within normal limits  URINALYSIS, ROUTINE W REFLEX MICROSCOPIC (NOT AT [facility]) - Abnormal; Notable for the following:    APPearance CLOUDY (*)    Leukocytes, UA SMALL (*)    All other components within normal limits  URINE MICROSCOPIC-ADD ON - Abnormal; Notable for the following:    Squamous Epithelial / LPF 6-30 (*)    Bacteria, UA FEW (*)    All other components within normal limits    EKG  EKG Interpretation None       Radiology No results found.  Procedures Procedures (including critical care time)  Medications Ordered in ED Medications  sodium chloride 0.9 %  bolus 1,000 mL (1,000 mLs Intravenous New Bag/Given 08/31/16 [id number])     Initial Impression / Assessment and Plan / ED Course  I have reviewed the triage vital signs and the nursing notes.  Pertinent labs & imaging results that were available during my care of the patient were reviewed by me and considered in my medical decision making (see chart for details).  Clinical Course     Rapid OB response called upon pt's arrival.  OB nurse placed pt on fetal monitor.  She is not in labor, but she is having a few contractions.  When pt contracts, pt notes increased vaginal discharge.  Best option for patient is to be [facility] for SROM r/o and further monitoring.  Dr. Henderson Cloudomblin (OB) accepted pt for transfer.    Final Clinical Impressions(s) / ED Diagnoses   Final diagnoses:  Generalized abdominal pain  [redacted] weeks gestation of pregnancy    New Prescriptions New Prescriptions   No medications on file     Jacalyn LefevreJulie Radiah Lubinski, MD 08/31/16 [phone]

## 2016-08-31 NOTE — ED Notes (Signed)
Notified OB Rapid reponse

## 2016-08-31 NOTE — Progress Notes (Signed)
Called to come to [place] for a G2P0 33.[redacted] weeks pregnant, pt of Dr Marcelle OverlieHolland, came in complaining of lower abdomen pain, increase discharge, vaginal soreness. Pt states baby is moving well, denies any complications with pregnancy. Called Dr Henderson Cloudomblin, informed of pt status, to have pt transferred to MAU to rule out SROM. ED Dr informed.

## 2016-08-31 NOTE — Progress Notes (Signed)
UCs more regular Will start magnesium sulfate tocolysis D/W patient

## 2016-08-31 NOTE — MAU Note (Signed)
[facility]  Been having pain in lower abd, more constant now.  Last night was a rough night.

## 2016-09-01 DIAGNOSIS — N898 Other specified noninflammatory disorders of vagina: Secondary | ICD-10-CM | POA: Diagnosis present

## 2016-09-01 DIAGNOSIS — N858 Other specified noninflammatory disorders of uterus: Secondary | ICD-10-CM | POA: Diagnosis present

## 2016-09-01 DIAGNOSIS — B009 Herpesviral infection, unspecified: Secondary | ICD-10-CM | POA: Diagnosis present

## 2016-09-01 DIAGNOSIS — Z3A33 33 weeks gestation of pregnancy: Secondary | ICD-10-CM | POA: Diagnosis not present

## 2016-09-01 DIAGNOSIS — R1084 Generalized abdominal pain: Secondary | ICD-10-CM | POA: Diagnosis present

## 2016-09-01 NOTE — Progress Notes (Signed)
Patient ID: Meagan Skinner, female   DOB: [birth year], 25 y.o.   MRN: [id number]   6954w1d  S// no signif ctx  O//BP (!) 107/59 (BP Location: Left Arm)   Pulse 96   Temp 97.8 F (36.6 C) (Oral)   Resp 20   Ht 5\' 5"  (1.651 m)   Wt 153 lb (69.4 kg)   SpO2 100%   BMI 25.46 kg/m   A+P// 8454w1d, PTL, cont MgSO4, second BMZ this afternoon

## 2016-09-02 LAB — CULTURE, BETA STREP (GROUP B ONLY)

## 2016-09-02 LAB — OB RESULTS CONSOLE GBS: GBS: NEGATIVE

## 2016-09-02 NOTE — Progress Notes (Signed)
Patient is doing well.  No major complaints.She is contracting every 7 to 8 and not felt by patient.  BP (!) 100/57   Pulse 97   Temp 98.3 F (36.8 C) (Oral)   Resp 16   Ht 5\' 5"  (1.651 m)   Wt 69.4 kg (153 lb)   SpO2 100%   BMI 25.46 kg/m  No results found for this or any previous visit (from the past 24 hour(s)). Abdomen is soft and non tender  IMPRESSION: IUP at 33 w 2 days Preterm Labor  PLAN: Increase Magnesium now to 2.5 gram per hour Steroid series completed Plan of care reviewed with patient

## 2016-09-03 MED ORDER — NIFEDIPINE 10 MG PO CAPS
10.0000 mg | ORAL_CAPSULE | Freq: Three times a day (TID) | ORAL | Status: DC
  Administered 2016-09-03 – 2016-09-04 (×3): 10 mg via ORAL
  Filled 2016-09-03 (×3): qty 1

## 2016-09-03 NOTE — Progress Notes (Signed)
Patient ID: Meagan Skinner, female   DOB: 1991-07-23, 25 y.o.   MRN: [id number] Pt resting.  Denies ctxs.  Wants d/c home  VSSAF FHR 140s  Ctxs irritability only Abd Gravid, nt Neg homans  PTL - will wean mag today to procardia Consider d/c home in am if stable S/p BMZ

## 2016-09-04 MED ORDER — VALACYCLOVIR HCL 500 MG PO TABS: 500.0000 mg | ORAL_TABLET | Freq: Every day | ORAL | 6 refills | Status: DC

## 2016-09-04 MED ORDER — NIFEDIPINE 10 MG PO CAPS: 10.0000 mg | ORAL_CAPSULE | Freq: Three times a day (TID) | ORAL | 2 refills | Status: DC | PRN

## 2016-09-04 MED ORDER — DOCUSATE SODIUM 100 MG PO CAPS: 100.0000 mg | ORAL_CAPSULE | Freq: Every day | ORAL | 0 refills | Status: DC

## 2016-09-04 NOTE — Discharge Instructions (Signed)

## 2016-09-04 NOTE — Discharge Summary (Signed)
Physician Discharge Summary  Patient ID: Meagan Skinner MRN: [id number] DOB/AGE: 1991-05-10 25 y.o.  Admit date: 08/31/2016 Discharge date: 09/04/2016  Admission Diagnoses:  PTL  Discharge Diagnoses:  Active Problems:   Premature cervical dilation in third trimester   Discharged Condition: stable  Hospital Course: Pt was admitted with PTL.  Initially treated with IV magnesium and given a course of BMZ.  Once her steroids were complete, magnesium was discontinued and she remained stable with procardia.  Today, she denies ctx, vb and lof.  Good FM  Consults: None  Significant Diagnostic Studies:    Treatments: tocolysis, steroids  Discharge Exam: Blood pressure 117/68, pulse 97, temperature 98.7 F (37.1 C), resp. rate 18, height 5\' 5"  (1.651 m), weight 160 lb 6.4 oz (72.8 kg), SpO2 100 %. General appearance: alert and cooperative GI: normal findings: gravid, NT Cervix - 2cm, unchanged  Disposition: 01-Home or Self Care     Medication List    TAKE these medications   docusate sodium 100 MG capsule Commonly known as:  COLACE Take 1 capsule (100 mg total) by mouth daily.   NIFEdipine 10 MG capsule Commonly known as:  PROCARDIA Take 1 capsule (10 mg total) by mouth every 8 (eight) hours as needed.   PRENATAL PO Take 1 tablet by mouth daily.   valACYclovir 500 MG tablet Commonly known as:  VALTREX Take 1 tablet (500 mg total) by mouth daily.   ZANTAC PO Take 1 tablet by mouth daily as needed (for heartburn).        Signed: Keaden Gunnoe 09/04/2016, 9:04 AM

## 2016-10-05 ENCOUNTER — Encounter (HOSPITAL_COMMUNITY): Payer: Self-pay | Admitting: *Deleted

## 2016-10-05 ENCOUNTER — Inpatient Hospital Stay (HOSPITAL_COMMUNITY)
Admission: AD | Admit: 2016-10-05 | Discharge: 2016-10-05 | Disposition: A | Discharge status: Home / Self Care | Payer: 59 | Source: Ambulatory Visit | Attending: Obstetrics and Gynecology | Admitting: Obstetrics and Gynecology

## 2016-10-05 DIAGNOSIS — N898 Other specified noninflammatory disorders of vagina: Secondary | ICD-10-CM | POA: Diagnosis not present

## 2016-10-05 DIAGNOSIS — O98513 Other viral diseases complicating pregnancy, third trimester: Secondary | ICD-10-CM | POA: Diagnosis not present

## 2016-10-05 DIAGNOSIS — O26893 Other specified pregnancy related conditions, third trimester: Secondary | ICD-10-CM

## 2016-10-05 DIAGNOSIS — Z3A00 Weeks of gestation of pregnancy not specified: Secondary | ICD-10-CM | POA: Insufficient documentation

## 2016-10-05 DIAGNOSIS — B009 Herpesviral infection, unspecified: Secondary | ICD-10-CM | POA: Insufficient documentation

## 2016-10-05 LAB — POCT FERN TEST
POCT FERN TEST: NEGATIVE
POCT FERN TEST: NEGATIVE

## 2016-10-05 NOTE — Discharge Instructions (Signed)
Braxton Hicks Contractions °Contractions of the uterus can occur throughout pregnancy. Contractions are not always a sign that you are in labor.  °WHAT ARE BRAXTON HICKS CONTRACTIONS?  °Contractions that occur before labor are called Braxton Hicks contractions, or false labor. Toward the end of pregnancy (32-34 weeks), these contractions can develop more often and may become more forceful. This is not true labor because these contractions do not result in opening (dilatation) and thinning of the cervix. They are sometimes difficult to tell apart from true labor because these contractions can be forceful and people have different pain tolerances. You should not feel embarrassed if you go to the hospital with false labor. Sometimes, the only way to tell if you are in true labor is for your health care provider to look for changes in the cervix. °If there are no prenatal problems or other health problems associated with the pregnancy, it is completely safe to be sent home with false labor and await the onset of true labor. °HOW CAN YOU TELL THE DIFFERENCE BETWEEN TRUE AND FALSE LABOR? °False Labor  °· The contractions of false labor are usually shorter and not as hard as those of true labor.   °· The contractions are usually irregular.   °· The contractions are often felt in the front of the lower abdomen and in the groin.   °· The contractions may go away when you walk around or change positions while lying down.   °· The contractions get weaker and are shorter lasting as time goes on.   °· The contractions do not usually become progressively stronger, regular, and closer together as with true labor.   °True Labor  °· Contractions in true labor last 30-70 seconds, become very regular, usually become more intense, and increase in frequency.   °· The contractions do not go away with walking.   °· The discomfort is usually felt in the top of the uterus and spreads to the lower abdomen and low back.   °· True labor can be  determined by your health care provider with an exam. This will show that the cervix is dilating and getting thinner.   °WHAT TO REMEMBER °· Keep up with your usual exercises and follow other instructions given by your health care provider.   °· Take medicines as directed by your health care provider.   °· Keep your regular prenatal appointments.   °· Eat and drink lightly if you think you are going into labor.   °· If Braxton Hicks contractions are making you uncomfortable:   °¨ Change your position from lying down or resting to walking, or from walking to resting.   °¨ Sit and rest in a tub of warm water.   °¨ Drink 2-3 glasses of water. Dehydration may cause these contractions.   °¨ Do slow and deep breathing several times an hour.   °WHEN SHOULD I SEEK IMMEDIATE MEDICAL CARE? °Seek immediate medical care if: °· Your contractions become stronger, more regular, and closer together.   °· You have fluid leaking or gushing from your vagina.   °· You have a fever.   °· You pass blood-tinged mucus.   °· You have vaginal bleeding.   °· You have continuous abdominal pain.   °· You have low back pain that you never had before.   °· You feel your baby's head pushing down and causing pelvic pressure.   °· Your baby is not moving as much as it used to.   °This information is not intended to replace advice given to you by your health care provider. Make sure you discuss any questions you have with your health care   provider. °Document Released: 10/12/2005 Document Revised: 02/03/2016 Document Reviewed: 07/24/2013 °Elsevier Interactive Patient Education © 2017 [employer] ° °

## 2016-10-05 NOTE — MAU Note (Signed)
Pt presents complaining of increased leaking of fluid for the last few hours and irregular contractions. Denies bleeding. Reports good fetal movement.

## 2016-10-05 NOTE — MAU Provider Note (Signed)
History   [id number]   Chief Complaint  Patient presents with  . Rupture of Membranes    HPI Meagan Skinner is a 25 y.o. female  G2P0010 here with report of watery vaginal discharge that began at approximately 2 hours prior to arrival. States goes to the bathroom frequently and feels trickling of fluid that is not urine. No odor to discharge. Reports occasional contractions. Denies vaginal bleeding. Positive fetal movement. No recent intercourse. Denies complications with pregnancy.     No LMP recorded. Patient is pregnant.  OB History  Gravida Para Term Preterm AB Living  2       1 0  SAB TAB Ectopic Multiple Live Births  0 1     0    # Outcome Date GA Lbr Len/2nd Weight Sex Delivery Anes PTL Lv  2 Current           1 TAB 2011 3069w0d             Past Medical History:  Diagnosis Date  . Headache   . Herpes    no outbreaks, +blood test  . Infection    uti    Family History  Problem Relation Age of Onset  . Diabetes Mother   . Diabetes Maternal Grandmother   . Crohn's disease Maternal Grandmother   . Fibromyalgia Maternal Grandmother   . Heart disease Maternal Grandmother   . Asthma Maternal Grandmother   . Stroke Maternal Grandmother   . Cancer Maternal Grandfather   . Asthma Sister   . Heart disease Sister   . Asthma Brother     Social History   Social History  . Marital status: Single    Spouse name: N/A  . Number of children: N/A  . Years of education: N/A   Social History Main Topics  . Smoking status: Never Smoker  . Smokeless tobacco: Never Used  . Alcohol use No  . Drug use: No  . Sexual activity: Yes    Birth control/ protection: None   Other Topics Concern  . None   Social History Narrative  . None    No Known Allergies  No current facility-administered medications on file prior to encounter.    Current Outpatient Prescriptions on File Prior to Encounter  Medication Sig Dispense Refill  . docusate sodium (COLACE) 100 MG  capsule Take 1 capsule (100 mg total) by mouth daily. 10 capsule 0  . valACYclovir (VALTREX) 500 MG tablet Take 1 tablet (500 mg total) by mouth daily. 30 tablet 6  . NIFEdipine (PROCARDIA) 10 MG capsule Take 1 capsule (10 mg total) by mouth every 8 (eight) hours as needed. (Patient not taking: Reported on 10/05/2016) 60 capsule 2     Review of Systems  Gastrointestinal: Negative for abdominal pain.  Genitourinary: Positive for vaginal discharge. Negative for vaginal bleeding.     Physical Exam   Vitals:   10/05/16 2045  BP: 119/65  Pulse: 81  Temp: 98.7 F (37.1 C)  TempSrc: Oral    Physical Exam  Nursing note and vitals reviewed. Constitutional: She is oriented to person, place, and time. She appears well-developed and well-nourished. No distress.  HENT:  Head: Normocephalic and atraumatic.  Eyes: Conjunctivae are normal. Right eye exhibits no discharge. Left eye exhibits no discharge. No scleral icterus.  Neck: Normal range of motion.  Respiratory: Effort normal. No respiratory distress.  Genitourinary: No bleeding in the vagina. Vaginal discharge (small amount of mucoid white discharge) found.  Genitourinary Comments: No pooling  Neurological: She is alert and oriented to person, place, and time.  Skin: Skin is warm and dry. She is not diaphoretic.  Psychiatric: She has a normal mood and affect. Her behavior is normal. Judgment and thought content normal.   Fetal Tracing:  Baseline: 135 Variability: moderate Accelerations: 15x15 Decelerations: none  Toco: 3-8 mins   MAU Course  Procedures Results for orders placed or performed during the hospital encounter of 10/05/16 (from the past 24 hour(s))  Fern Test     Status: None   Collection Time: 10/05/16  9:00 PM  Result Value Ref Range   POCT Fern Test Negative = intact amniotic membranes     MDM No pooling & fern negative Reactive fetal tracing S/w Dr. Elon SpannerLeger; ok to discharge home  Assessment and Plan   A: 1. Vaginal discharge during pregnancy in third trimester     P: Discharge home Discussed reasons to return to MAU Keep f/u with OB   Judeth HornErin Evolett Somarriba, NP 10/05/2016 9:16 PM

## 2016-10-07 ENCOUNTER — Telehealth (HOSPITAL_COMMUNITY): Payer: Self-pay | Admitting: *Deleted

## 2016-10-07 NOTE — Telephone Encounter (Signed)
Preadmission screen  

## 2016-10-08 ENCOUNTER — Encounter (HOSPITAL_COMMUNITY): Payer: Self-pay | Admitting: *Deleted

## 2016-10-09 ENCOUNTER — Inpatient Hospital Stay (HOSPITAL_COMMUNITY)
Admission: AD | Admit: 2016-10-09 | Discharge: 2016-10-11 | DRG: 774 | Disposition: A | Discharge status: Home / Self Care | Payer: 59 | Source: Ambulatory Visit | Attending: Obstetrics and Gynecology | Admitting: Obstetrics and Gynecology

## 2016-10-09 ENCOUNTER — Encounter (HOSPITAL_COMMUNITY): Payer: Self-pay | Admitting: *Deleted

## 2016-10-09 ENCOUNTER — Inpatient Hospital Stay (HOSPITAL_COMMUNITY): Payer: 59 | Admitting: Anesthesiology

## 2016-10-09 DIAGNOSIS — Z833 Family history of diabetes mellitus: Secondary | ICD-10-CM | POA: Diagnosis not present

## 2016-10-09 DIAGNOSIS — Z823 Family history of stroke: Secondary | ICD-10-CM | POA: Diagnosis not present

## 2016-10-09 DIAGNOSIS — Z3493 Encounter for supervision of normal pregnancy, unspecified, third trimester: Secondary | ICD-10-CM | POA: Diagnosis present

## 2016-10-09 DIAGNOSIS — Z8249 Family history of ischemic heart disease and other diseases of the circulatory system: Secondary | ICD-10-CM

## 2016-10-09 DIAGNOSIS — Z3A38 38 weeks gestation of pregnancy: Secondary | ICD-10-CM

## 2016-10-09 LAB — CBC
HEMATOCRIT: 34.6 % — AB (ref 36.0–46.0)
HEMOGLOBIN: 11.7 g/dL — AB (ref 12.0–15.0)
MCH: 29.6 pg (ref 26.0–34.0)
MCHC: 33.8 g/dL (ref 30.0–36.0)
MCV: 87.6 fL (ref 78.0–100.0)
Platelets: 312 10*3/uL (ref 150–400)
RBC: 3.95 MIL/uL (ref 3.87–5.11)
RDW: 14.3 % (ref 11.5–15.5)
WBC: 14.2 10*3/uL — ABNORMAL HIGH (ref 4.0–10.5)

## 2016-10-09 LAB — TYPE AND SCREEN
ABO/RH(D): B POS
Antibody Screen: NEGATIVE

## 2016-10-09 LAB — RPR: RPR: NONREACTIVE

## 2016-10-09 MED ORDER — FLEET ENEMA 7-19 GM/118ML RE ENEM: 1.0000 | ENEMA | RECTAL | Status: DC | PRN

## 2016-10-09 MED ORDER — ONDANSETRON HCL 4 MG/2ML IJ SOLN: 4.0000 mg | INTRAMUSCULAR | Status: DC | PRN

## 2016-10-09 MED ORDER — COCONUT OIL OIL: 1.0000 "application " | TOPICAL_OIL | Status: DC | PRN

## 2016-10-09 MED ORDER — OXYTOCIN 40 UNITS IN LACTATED RINGERS INFUSION - SIMPLE MED
2.5000 [IU]/h | INTRAVENOUS | Status: DC
  Filled 2016-10-09: qty 1000

## 2016-10-09 MED ORDER — ONDANSETRON HCL 4 MG PO TABS: 4.0000 mg | ORAL_TABLET | ORAL | Status: DC | PRN

## 2016-10-09 MED ORDER — BENZOCAINE-MENTHOL 20-0.5 % EX AERO
1.0000 "application " | INHALATION_SPRAY | CUTANEOUS | Status: DC | PRN
  Filled 2016-10-09: qty 56

## 2016-10-09 MED ORDER — PHENYLEPHRINE 40 MCG/ML (10ML) SYRINGE FOR IV PUSH (FOR BLOOD PRESSURE SUPPORT)
80.0000 ug | PREFILLED_SYRINGE | INTRAVENOUS | Status: DC | PRN
  Filled 2016-10-09: qty 10
  Filled 2016-10-09: qty 5

## 2016-10-09 MED ORDER — OXYCODONE-ACETAMINOPHEN 5-325 MG PO TABS: 2.0000 | ORAL_TABLET | ORAL | Status: DC | PRN

## 2016-10-09 MED ORDER — MEASLES, MUMPS & RUBELLA VAC ~~LOC~~ INJ
0.5000 mL | INJECTION | Freq: Once | SUBCUTANEOUS | Status: DC
  Filled 2016-10-09: qty 0.5

## 2016-10-09 MED ORDER — PRENATAL MULTIVITAMIN CH
1.0000 | ORAL_TABLET | Freq: Every day | ORAL | Status: DC
  Administered 2016-10-10: 1 via ORAL
  Filled 2016-10-09: qty 1

## 2016-10-09 MED ORDER — LACTATED RINGERS IV SOLN
INTRAVENOUS | Status: DC
  Administered 2016-10-09: 11:00:00 via INTRAVENOUS

## 2016-10-09 MED ORDER — IBUPROFEN 600 MG PO TABS
600.0000 mg | ORAL_TABLET | Freq: Four times a day (QID) | ORAL | Status: DC
  Administered 2016-10-09 – 2016-10-11 (×7): 600 mg via ORAL
  Filled 2016-10-09 (×7): qty 1

## 2016-10-09 MED ORDER — TETANUS-DIPHTH-ACELL PERTUSSIS 5-2.5-18.5 LF-MCG/0.5 IM SUSP: 0.5000 mL | Freq: Once | INTRAMUSCULAR | Status: DC

## 2016-10-09 MED ORDER — DIPHENHYDRAMINE HCL 25 MG PO CAPS: 25.0000 mg | ORAL_CAPSULE | Freq: Four times a day (QID) | ORAL | Status: DC | PRN

## 2016-10-09 MED ORDER — EPHEDRINE 5 MG/ML INJ
10.0000 mg | INTRAVENOUS | Status: DC | PRN
  Filled 2016-10-09: qty 2

## 2016-10-09 MED ORDER — METHYLERGONOVINE MALEATE 0.2 MG/ML IJ SOLN
INTRAMUSCULAR | Status: AC
  Filled 2016-10-09: qty 1

## 2016-10-09 MED ORDER — EPHEDRINE 5 MG/ML INJ
10.0000 mg | INTRAVENOUS | Status: DC | PRN
Start: 2016-10-09 — End: 2016-10-09
  Filled 2016-10-09: qty 2

## 2016-10-09 MED ORDER — FENTANYL 2.5 MCG/ML BUPIVACAINE 1/10 % EPIDURAL INFUSION (WH - ANES)
14.0000 mL/h | INTRAMUSCULAR | Status: DC | PRN
  Administered 2016-10-09 (×2): 14 mL/h via EPIDURAL
  Filled 2016-10-09 (×2): qty 100

## 2016-10-09 MED ORDER — ACETAMINOPHEN 325 MG PO TABS
650.0000 mg | ORAL_TABLET | ORAL | Status: DC | PRN
Start: 2016-10-09 — End: 2016-10-11

## 2016-10-09 MED ORDER — PHENYLEPHRINE 40 MCG/ML (10ML) SYRINGE FOR IV PUSH (FOR BLOOD PRESSURE SUPPORT)
80.0000 ug | PREFILLED_SYRINGE | INTRAVENOUS | Status: DC | PRN
Start: 2016-10-09 — End: 2016-10-09
  Filled 2016-10-09: qty 10
  Filled 2016-10-09: qty 5

## 2016-10-09 MED ORDER — OXYCODONE-ACETAMINOPHEN 5-325 MG PO TABS: 1.0000 | ORAL_TABLET | ORAL | Status: DC | PRN

## 2016-10-09 MED ORDER — LIDOCAINE HCL (PF) 1 % IJ SOLN
30.0000 mL | INTRAMUSCULAR | Status: DC | PRN
  Filled 2016-10-09: qty 30

## 2016-10-09 MED ORDER — SENNOSIDES-DOCUSATE SODIUM 8.6-50 MG PO TABS
2.0000 | ORAL_TABLET | ORAL | Status: DC
  Administered 2016-10-09 – 2016-10-11 (×2): 2 via ORAL
  Filled 2016-10-09 (×2): qty 2

## 2016-10-09 MED ORDER — ZOLPIDEM TARTRATE 5 MG PO TABS: 5.0000 mg | ORAL_TABLET | Freq: Every evening | ORAL | Status: DC | PRN

## 2016-10-09 MED ORDER — TERBUTALINE SULFATE 1 MG/ML IJ SOLN
0.2500 mg | Freq: Once | INTRAMUSCULAR | Status: DC | PRN
  Filled 2016-10-09: qty 1

## 2016-10-09 MED ORDER — LACTATED RINGERS IV BOLUS (SEPSIS): 500.0000 mL | Freq: Once | INTRAVENOUS | Status: DC

## 2016-10-09 MED ORDER — WITCH HAZEL-GLYCERIN EX PADS
1.0000 | MEDICATED_PAD | CUTANEOUS | Status: DC | PRN
Start: 2016-10-09 — End: 2016-10-11

## 2016-10-09 MED ORDER — SIMETHICONE 80 MG PO CHEW
80.0000 mg | CHEWABLE_TABLET | ORAL | Status: DC | PRN
Start: 2016-10-09 — End: 2016-10-11

## 2016-10-09 MED ORDER — DIBUCAINE 1 % RE OINT: 1.0000 "application " | TOPICAL_OINTMENT | RECTAL | Status: DC | PRN

## 2016-10-09 MED ORDER — SOD CITRATE-CITRIC ACID 500-334 MG/5ML PO SOLN: 30.0000 mL | ORAL | Status: DC | PRN

## 2016-10-09 MED ORDER — LIDOCAINE HCL (PF) 1 % IJ SOLN
INTRAMUSCULAR | Status: DC | PRN
  Administered 2016-10-09 (×2): 5 mL

## 2016-10-09 MED ORDER — OXYCODONE-ACETAMINOPHEN 5-325 MG PO TABS
1.0000 | ORAL_TABLET | ORAL | Status: DC | PRN
  Administered 2016-10-09: 1 via ORAL
  Filled 2016-10-09: qty 1

## 2016-10-09 MED ORDER — LACTATED RINGERS IV SOLN: 500.0000 mL | Freq: Once | INTRAVENOUS | Status: DC

## 2016-10-09 MED ORDER — ONDANSETRON HCL 4 MG/2ML IJ SOLN: 4.0000 mg | Freq: Four times a day (QID) | INTRAMUSCULAR | Status: DC | PRN

## 2016-10-09 MED ORDER — METHYLERGONOVINE MALEATE 0.2 MG/ML IJ SOLN
0.2000 mg | Freq: Once | INTRAMUSCULAR | Status: AC
  Administered 2016-10-09: 0.2 mg via INTRAMUSCULAR

## 2016-10-09 MED ORDER — DIPHENHYDRAMINE HCL 50 MG/ML IJ SOLN: 12.5000 mg | INTRAMUSCULAR | Status: DC | PRN

## 2016-10-09 MED ORDER — LACTATED RINGERS IV SOLN: 500.0000 mL | INTRAVENOUS | Status: DC | PRN

## 2016-10-09 MED ORDER — MEDROXYPROGESTERONE ACETATE 150 MG/ML IM SUSP: 150.0000 mg | INTRAMUSCULAR | Status: DC | PRN

## 2016-10-09 MED ORDER — OXYTOCIN 40 UNITS IN LACTATED RINGERS INFUSION - SIMPLE MED
1.0000 m[IU]/min | INTRAVENOUS | Status: DC
  Administered 2016-10-09: 1 m[IU]/min via INTRAVENOUS

## 2016-10-09 MED ORDER — ACETAMINOPHEN 325 MG PO TABS: 650.0000 mg | ORAL_TABLET | ORAL | Status: DC | PRN

## 2016-10-09 MED ORDER — OXYTOCIN BOLUS FROM INFUSION
500.0000 mL | Freq: Once | INTRAVENOUS | Status: AC
  Administered 2016-10-09: 500 mL via INTRAVENOUS

## 2016-10-09 NOTE — Lactation Note (Signed)
This note was copied from a baby's chart. Lactation Consultation Note  Patient Name: Boy Vanita Pandaiffany Finney-jones [id number]'QToday's Date: 10/09/2016 Reason for consult: Initial assessment   Initial consult at 9 hrs old; GA 38.4; BW 8 lbs, 10.8oz.  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 Infant has breastfed x1 (30 min) + attempt x1 (0 min); voids-0; stools-0.  No LS recorded yet. Infant STS with mom when LC entered room and not cueing or waking to latch. LC taught hand expression with return demonstration and observation of milk easily flowing. LC gave spoons, curved-tip syringe, and colostrum collection containers for EBM feedings if infant too sleepy to awake for feedings. Mom hand expressed 1 ml in container.  FOB at bedside assisting with HE. Infant got bath and still would not latch after bathing.  Attempted to latch on right side football hold.  Taught mom positioning and sandwiching of breast but infant too sleepy.  HE few drops onto infant's lips.  Still too sleepy for latching.   LC reviewed feeding cues, size of belly, and cluster feeding.  Encouraged to feed with cues or spoon feed to entice infant to latch. Lactation brochure given and informed of hospital support group and outpatient services. Encouraged to call for assistance as needed with latching.     Maternal Data Has patient been taught Hand Expression?: Yes Does the patient have breastfeeding experience prior to this delivery?: No  Feeding Feeding Type: Breast Fed Length of feed: 0 min  LATCH Score/Interventions Latch: Too sleepy or reluctant, no latch achieved, no sucking elicited.     Type of Nipple: Everted at rest and after stimulation  Comfort (Breast/Nipple): Soft / non-tender           Lactation Tools Discussed/Used WIC Program: Yes   Consult Status Consult Status: Follow-up Date: 10/10/16 Follow-up type: In-patient    Lendon KaVann, Keslee Harrington Walker 10/09/2016, 10:43 PM

## 2016-10-09 NOTE — H&P (Signed)
Meagan Skinner is a 25 y.o. female presenting for active labor.  Pregnancy complicated by PTL.  GBS-.  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 OB History    Gravida Para Term Preterm AB Living   2       1 0   SAB TAB Ectopic Multiple Live Births   0 1     0     Past Medical History:  Diagnosis Date  . Headache   . Herpes    no outbreaks, +blood test  . Infection    uti   Past Surgical History:  Procedure Laterality Date  . MULTIPLE TOOTH EXTRACTIONS     Family History: family history includes Asthma in her brother, maternal grandmother, and sister; Cancer in her maternal grandfather; Crohn's disease in her maternal grandmother; Diabetes in her maternal grandmother and mother; Fibromyalgia in her maternal grandmother; Heart disease in her maternal grandmother and sister; Stroke in her maternal grandmother. Social History:  reports that she has never smoked. She has never used smokeless tobacco. She reports that she does not drink alcohol or use drugs.     Maternal Diabetes: No Genetic Screening: Normal Maternal Ultrasounds/Referrals: Normal Fetal Ultrasounds or other Referrals:  None Maternal Substance Abuse:  No Significant Maternal Medications:  None Significant Maternal Lab Results:  None Other Comments:  None  ROS History Dilation: 8 Effacement (%): 100 Station: -2 Exam by:: Dr. Rana SnareLowe Blood pressure 115/73, pulse (!) 103, temperature 98.2 F (36.8 C), temperature source Oral, resp. rate 18, height 5\' 5"  (1.651 m), weight 166 lb (75.3 kg), SpO2 100 %. Exam Physical Exam  Prenatal labs: ABO, Rh: --/--/B POS (12/15 0136) Antibody: NEG (12/15 0136) Rubella: Immune (05/30 0000) RPR: Nonreactive (05/30 0000)  HBsAg: Negative (05/30 0000)  HIV: Non-reactive (05/30 0000)  GBS: Negative (11/08 0000)   Assessment/Plan: IUP at term Active labor Anticipate SVD   Shannyn Jankowiak C 10/09/2016, 10:17 AM

## 2016-10-09 NOTE — Anesthesia Postprocedure Evaluation (Signed)
Anesthesia Post Note  Patient: Meagan Skinner  Procedure(s) Performed: * No procedures listed *  Patient location during evaluation: Mother Baby Anesthesia Type: Epidural Level of consciousness: awake and alert Pain management: pain level controlled Vital Signs Assessment: post-procedure vital signs reviewed and stable Respiratory status: spontaneous breathing and nonlabored ventilation Cardiovascular status: stable Postop Assessment: no headache, patient able to bend at knees, no backache, no signs of nausea or vomiting, epidural receding and adequate PO intake Anesthetic complications: no     Last Vitals:  Vitals:   10/09/16 1426 10/09/16 1524  BP: 120/82 123/70  Pulse: 90 85  Resp: 18 18  Temp: 36.8 C 36.7 C    Last Pain:  Vitals:   10/09/16 1655  TempSrc:   PainSc: 0-No pain   Pain Goal: Patients Stated Pain Goal: 8 (10/09/16 0133)               Laban EmperorMalinova,Garnette Greb Hristova

## 2016-10-09 NOTE — Anesthesia Procedure Notes (Signed)
Epidural Patient location during procedure: OB  Staffing Anesthesiologist: Phillips GroutARIGNAN, Melea Prezioso Performed: anesthesiologist   Preanesthetic Checklist Completed: patient identified, site marked, surgical consent, pre-op evaluation, timeout performed, IV checked, risks and benefits discussed and monitors and equipment checked  Epidural Patient position: sitting Prep: DuraPrep Patient monitoring: heart rate, continuous pulse ox and blood pressure Approach: right paramedian Location: L4-L5 Injection technique: LOR saline  Needle:  Needle type: Tuohy  Needle gauge: 17 G Needle length: 9 cm and 9 Needle insertion depth: 5 cm Catheter type: closed end flexible Catheter size: 20 Guage Catheter at skin depth: 8 cm Test dose: negative  Assessment Events: blood not aspirated, injection not painful, no injection resistance, negative IV test and no paresthesia  Additional Notes Patient identified. Risks/Benefits/Options discussed with patient including but not limited to bleeding, infection, nerve damage, paralysis, failed block, incomplete pain control, headache, blood pressure changes, nausea, vomiting, reactions to medication both or allergic, itching and postpartum back pain. Confirmed with bedside nurse the patient's most recent platelet count. Confirmed with patient that they are not currently taking any anticoagulation, have any bleeding history or any family history of bleeding disorders. Patient expressed understanding and wished to proceed. All questions were answered. Sterile technique was used throughout the entire procedure. Please see nursing notes for vital signs. Test dose was given through epidural needle and negative prior to continuing to dose epidural or start infusion. Warning signs of high block given to the patient including shortness of breath, tingling/numbness in hands, complete motor block, or any concerning symptoms with instructions to call for help. Patient was given  instructions on fall risk and not to get out of bed. All questions and concerns addressed with instructions to call with any issues.

## 2016-10-09 NOTE — Anesthesia Preprocedure Evaluation (Signed)

## 2016-10-09 NOTE — Anesthesia Pain Management Evaluation Note (Signed)
  CRNA Pain Management Visit Note  Patient: Meagan Skinner, 25 y.o., female  "Hello I am a member of the anesthesia team at [facility]. We have an anesthesia team available at all times to provide care throughout the hospital, including epidural management and anesthesia for C-section. I don't know your plan for the delivery whether it a natural birth, water birth, IV sedation, nitrous supplementation, doula or epidural, but we want to meet your pain goals."   1.Was your pain managed to your expectations on prior hospitalizations?   No prior hospitalizations  2.What is your expectation for pain management during this hospitalization?     Epidural  3.How can we help you reach that goal? epidural  Record the patient's initial score and the patient's pain goal.   Pain: 0  Pain Goal: 4 [facility] wants you to be able to say your pain was always managed very well.  Abhiram Criado 10/09/2016

## 2016-10-09 NOTE — MAU Note (Signed)
Patient presents with c/o ctxs

## 2016-10-09 NOTE — Progress Notes (Signed)
Dr. Krista BlueSinger in department. RN notified Dr. Krista BlueSinger of total blood loss and asked Dr. Krista BlueSinger if he wanted to wait to pull the epidural catheter until CBC came back tomorrow morning. Dr. Krista BlueSinger said it was okay to go ahead and pull epidural catheter before [facility].

## 2016-10-10 LAB — CBC
HCT: 21.2 % — ABNORMAL LOW (ref 36.0–46.0)
Hemoglobin: 7.5 g/dL — ABNORMAL LOW (ref 12.0–15.0)
MCH: 30.6 pg (ref 26.0–34.0)
MCHC: 35.4 g/dL (ref 30.0–36.0)
MCV: 86.5 fL (ref 78.0–100.0)
PLATELETS: 239 10*3/uL (ref 150–400)
RBC: 2.45 MIL/uL — AB (ref 3.87–5.11)
RDW: 14.3 % (ref 11.5–15.5)
WBC: 16.8 10*3/uL — ABNORMAL HIGH (ref 4.0–10.5)

## 2016-10-10 MED ORDER — FERROUS SULFATE 325 (65 FE) MG PO TABS
325.0000 mg | ORAL_TABLET | Freq: Every day | ORAL | Status: DC
  Administered 2016-10-10: 325 mg via ORAL
  Filled 2016-10-10 (×2): qty 1

## 2016-10-10 NOTE — Progress Notes (Signed)
Post Partum Day 1 Subjective: no complaints, up ad lib, voiding and tolerating PO  Objective: Blood pressure 109/66, pulse 82, temperature 97.3 F (36.3 C), temperature source Oral, resp. rate 18, height 5\' 5"  (1.651 m), weight 166 lb (75.3 kg), SpO2 100 %, unknown if currently breastfeeding.  Physical Exam:  General: alert, cooperative, appears stated age and no distress Lochia: appropriate Uterine Fundus: firm Incision: healing well DVT Evaluation: No evidence of DVT seen on physical exam.   Recent Labs  10/09/16 0136 10/10/16 0525  HGB 11.7* 7.5*  HCT 34.6* 21.2*    Assessment/Plan: Plan for discharge tomorrow and Breastfeeding   LOS: 1 day   Meagan Skinner C 10/10/2016, 7:28 AM

## 2016-10-11 MED ORDER — IBUPROFEN 600 MG PO TABS: 600.0000 mg | ORAL_TABLET | Freq: Four times a day (QID) | ORAL | 0 refills | Status: DC

## 2016-10-11 NOTE — Discharge Summary (Signed)
Obstetric Discharge Summary Reason for Admission: onset of labor Prenatal Procedures: none Intrapartum Procedures: spontaneous vaginal delivery Postpartum Procedures: none Complications-Operative and Postpartum: hemorrhage Hemoglobin  Date Value Ref Range Status  10/10/2016 7.5 (L) 12.0 - 15.0 g/dL Final    Comment:    DELTA CHECK NOTED REPEATED TO VERIFY    HCT  Date Value Ref Range Status  10/10/2016 21.2 (L) 36.0 - 46.0 % Final    Physical Exam:  General: alert, cooperative, appears stated age and no distress Lochia: appropriate Uterine Fundus: firm Incision: healing well DVT Evaluation: No evidence of DVT seen on physical exam.  Discharge Diagnoses: Term Pregnancy-delivered  Discharge Information: Date: 10/11/2016 Activity: pelvic rest Diet: routine Medications: Ibuprofen and Iron Condition: stable Instructions: refer to practice specific booklet Discharge to: home   Newborn Data: Live born female  Birth Weight: 8 lb 10.8 oz (3935 g) APGAR: 8, 9  Home with mother.  Meagan Skinner C 10/11/2016, 8:33 AM

## 2016-10-11 NOTE — Lactation Note (Signed)
This note was copied from a baby's chart. Lactation Consultation Note  Mom reports that feedings are going well. Baby is asleep in her arms.  Anticipatory guidance regarding milk coming to volume and how to soften the nipple areolar complex if needed. Reminded of support groups and outpatient services.  Patient Name: Boy Vanita Pandaiffany Finney-jones ZOXWR'UToday's Date: 10/11/2016 Reason for consult: Follow-up assessment   Maternal Data    Feeding Feeding Type: Breast Fed Length of feed: 30 min  LATCH Score/Interventions                      Lactation Tools Discussed/Used     Consult Status Consult Status: Complete    Soyla DryerJoseph, Milfred Krammes 10/11/2016, 11:03 AM

## 2016-10-13 ENCOUNTER — Inpatient Hospital Stay (HOSPITAL_COMMUNITY): Admission: RE | Admit: 2016-10-13 | Payer: 59 | Source: Ambulatory Visit

## 2016-12-21 ENCOUNTER — Ambulatory Visit: Payer: 59 | Admitting: Obstetrics and Gynecology

## 2017-01-23 ENCOUNTER — Emergency Department (HOSPITAL_COMMUNITY)
Admission: EM | Admit: 2017-01-23 | Discharge: 2017-01-23 | Disposition: A | Discharge status: Home / Self Care | Payer: No Typology Code available for payment source | Attending: Emergency Medicine | Admitting: Emergency Medicine

## 2017-01-23 ENCOUNTER — Encounter (HOSPITAL_COMMUNITY): Payer: Self-pay | Admitting: Vascular Surgery

## 2017-01-23 DIAGNOSIS — Y92410 Unspecified street and highway as the place of occurrence of the external cause: Secondary | ICD-10-CM | POA: Diagnosis not present

## 2017-01-23 DIAGNOSIS — Y999 Unspecified external cause status: Secondary | ICD-10-CM | POA: Diagnosis not present

## 2017-01-23 DIAGNOSIS — S8991XA Unspecified injury of right lower leg, initial encounter: Secondary | ICD-10-CM | POA: Insufficient documentation

## 2017-01-23 DIAGNOSIS — Y939 Activity, unspecified: Secondary | ICD-10-CM | POA: Insufficient documentation
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
## 2017-01-23 NOTE — Discharge Instructions (Signed)
You may take up to 1000 mg of tylenol for muscular soreness.  If you need additional pain control you may add 600 mg of ibuprofen and take a total of 1000 mg of tylenol + 600 mg of ibuprofen every 8 hours.  Rest for the next 48 hours.  After 48 hours you may ease into normal activity.  Heating pad and light massages and stretches may help with muscular soreness and tightness.
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
## 2017-01-23 NOTE — ED Notes (Signed)
Pt stable, understands discharge instructions, and reasons for return.   
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
## 2018-01-25 IMAGING — US US MFM OB COMP +14 WKS
1 series · 13 of 28 positions shown · non-contrast
Comparison: none

[Series 1: us mfm ob comp +14 wks · 100 acquisitions, 13 frames shown]
[im 4/100]
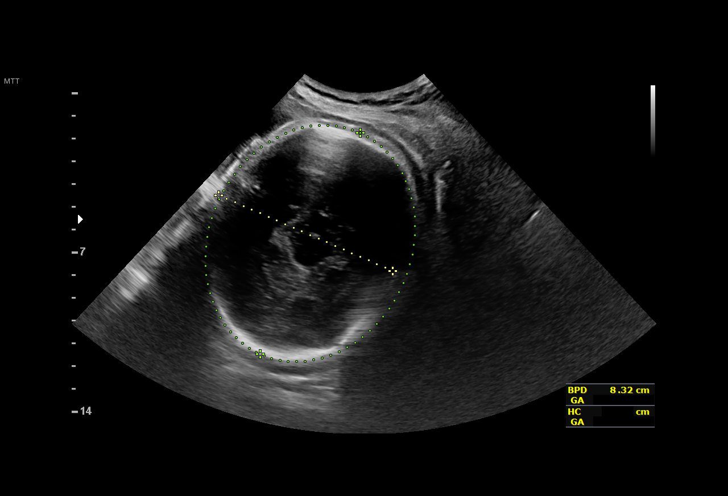
[im 12/100]
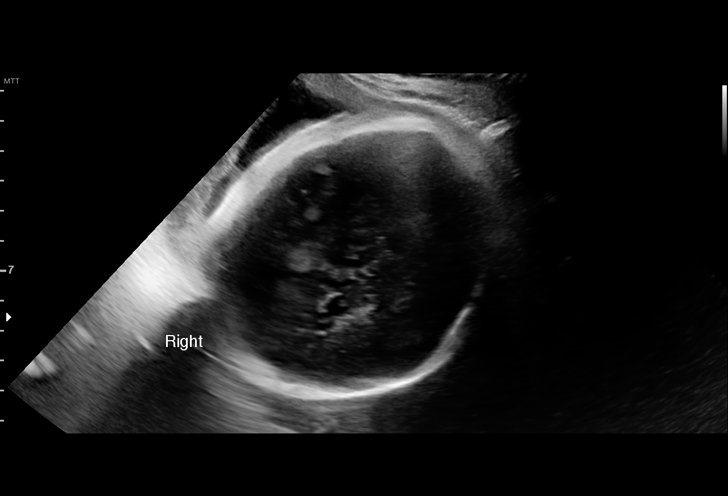
[im 19/100]
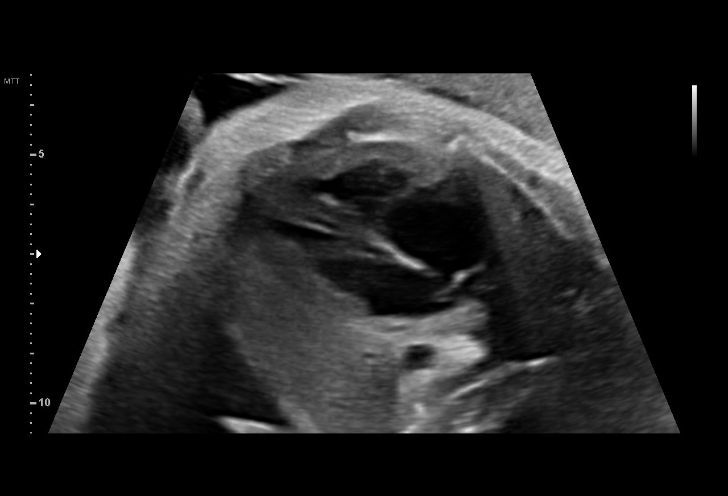
[im 26/100]
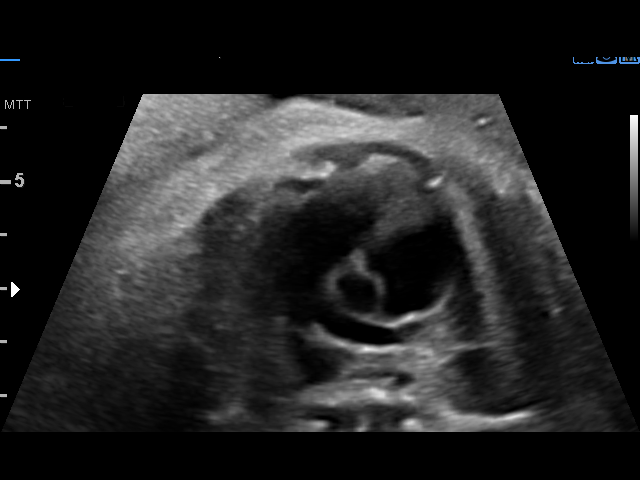
[im 34/100]
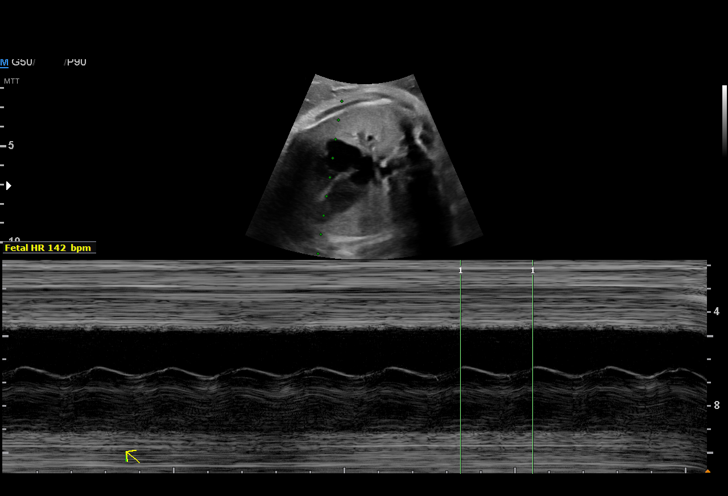
[im 41/100]
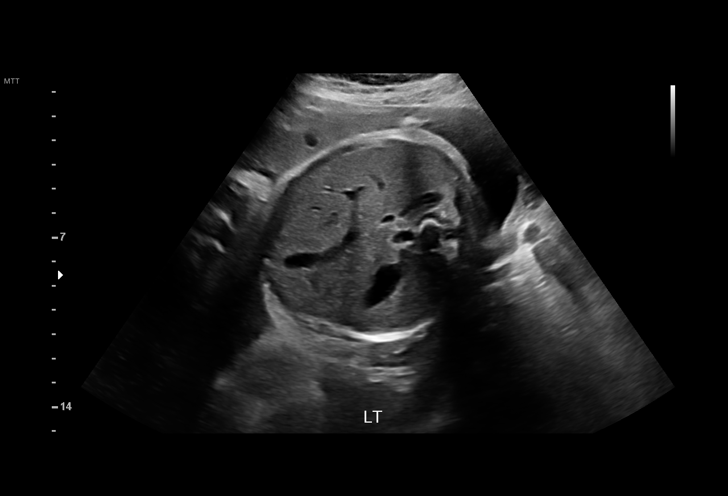
[im 52/100]
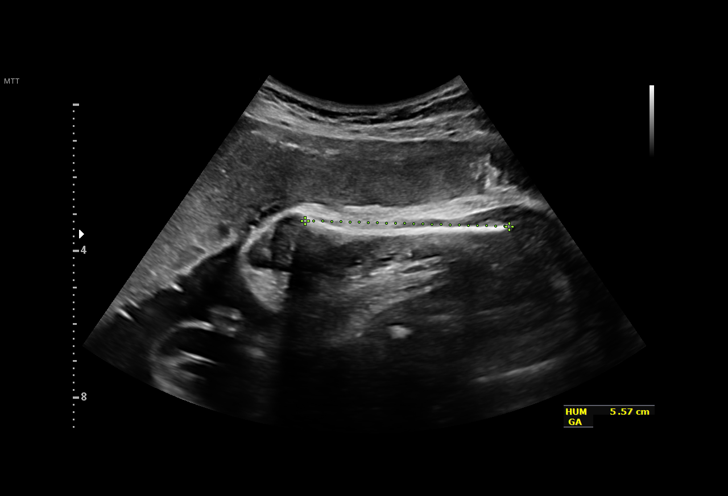
[im 59/100]
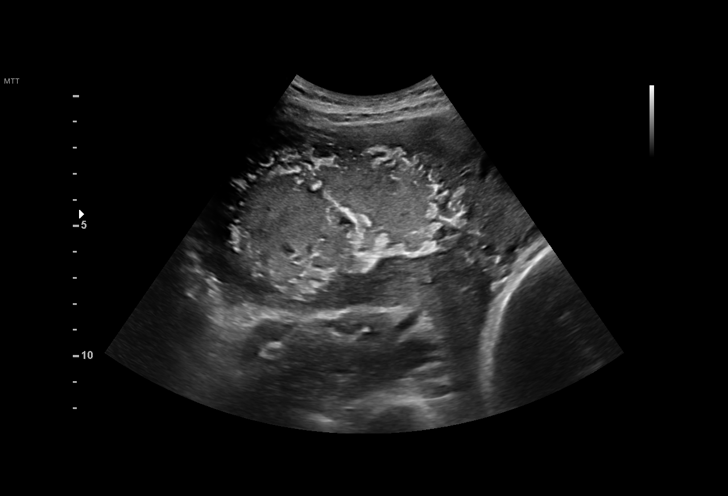
[im 67/100]
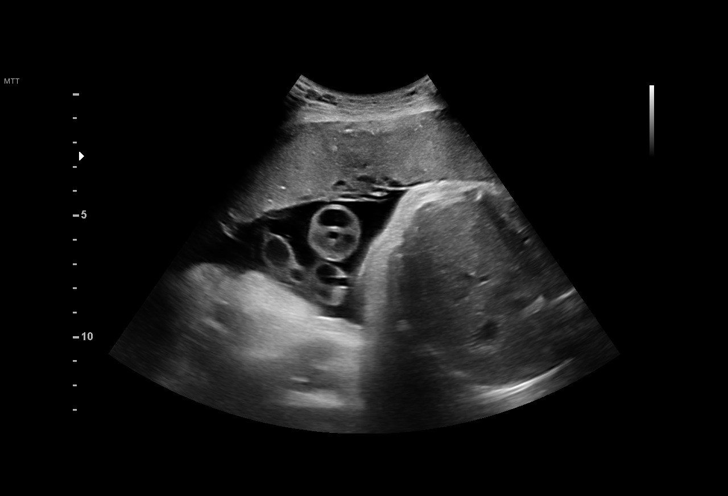
[im 74/100]
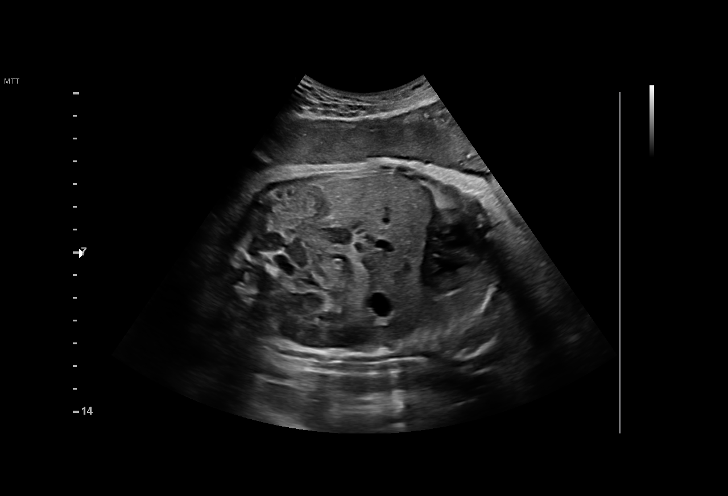
[im 81/100]
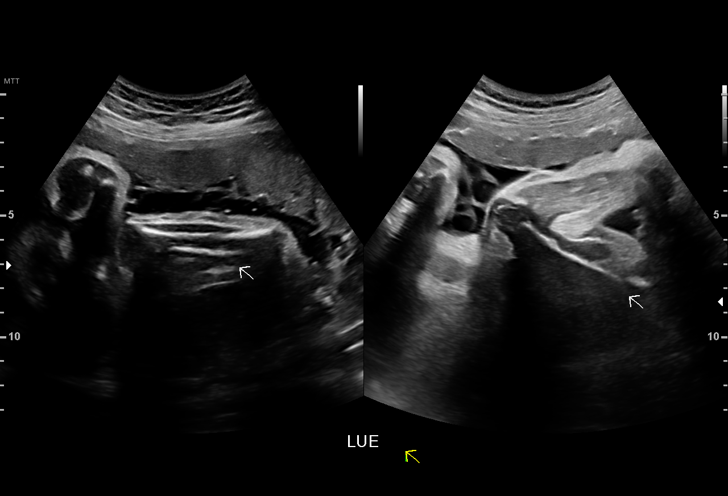
[im 89/100]
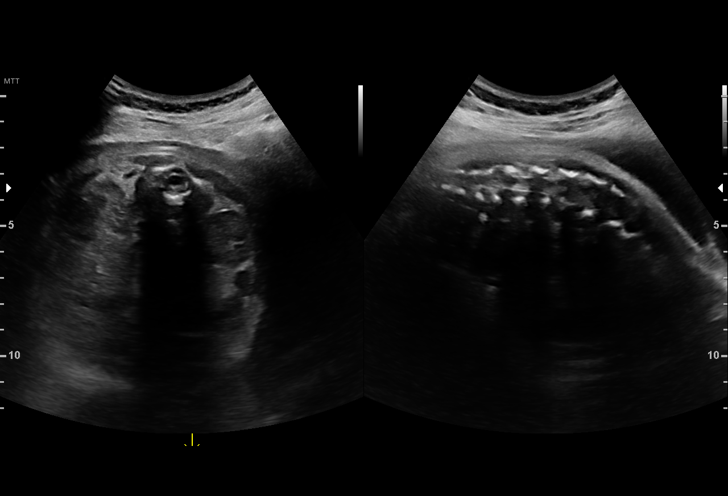
[im 96/100]
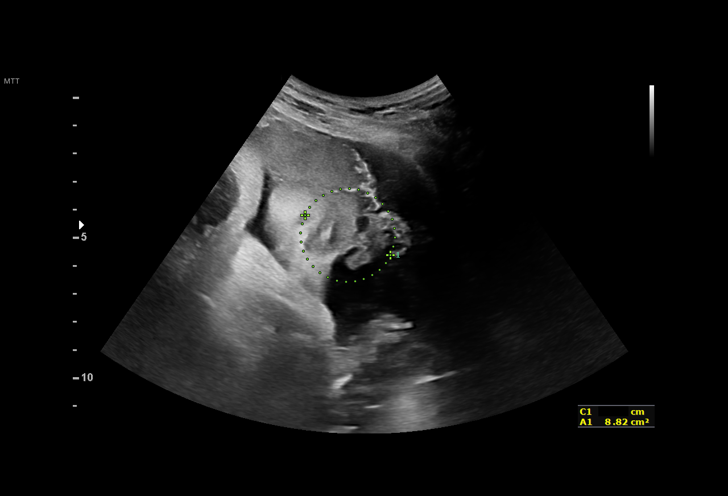

[13 of 28 positions shown; findings below may reference images not displayed]

[facility]; [HOSPITAL]
MAU/Triage

1  RONIELSON SABADIN            [id number]      [id number]     [id number]
Indications

33 weeks gestation of pregnancy
Cervical shortening, third trimester
Encounter for other antenatal screening
follow-up
OB History

Blood Type:            Height:  5'5"   Weight (lb):  153       BMI:
Gravidity:    2         Term:   0        Prem:   0        SAB:   0
TOP:          1       Ectopic:  0        Living: 0
Fetal Evaluation

Num Of Fetuses:     1
Fetal Heart         142
Rate(bpm):
Cardiac Activity:   Observed
Presentation:       Cephalic
Placenta:           Anterior, above cervical os
P. Cord Insertion:  Visualized, central

Amniotic Fluid
AFI FV:      Subjectively within normal limits

AFI Sum(cm)     %Tile
14.87           53

RUQ(cm)       RLQ(cm)       LUQ(cm)        LLQ(cm)
2.88
Biometry
BPD:      82.5  mm     G. Age:  33w 1d         49  %    CI:        74.71   %    70 - 86
FL/HC:      21.2   %    19.9 -
HC:      302.9  mm     G. Age:  33w 5d         29  %    HC/AC:      0.98        0.96 -
AC:       310   mm     G. Age:  34w 6d         93  %    FL/BPD:     77.8   %    71 - 87
FL:       64.2  mm     G. Age:  33w 1d         42  %    FL/AC:      20.7   %    20 - 24
HUM:        56  mm     G. Age:  32w 4d         50  %

Est. FW:    6154  gm      5 lb 3 oz     78  %
Gestational Age

Clinical EDD:  33w 0d                                        EDD:   10/19/16
U/S Today:     33w 5d                                        EDD:   10/14/16
Best:          33w 0d     Det. By:  Clinical EDD             EDD:   10/19/16
Anatomy

Cranium:               Appears normal         LVOT:                   Appears normal
Cavum:                 Not well visualized    Aortic Arch:            Appears normal
Ventricles:            Appears normal         Ductal Arch:            Appears normal
Choroid Plexus:        Appears normal         Diaphragm:              Appears normal
Cerebellum:            Not well visualized    Stomach:                Appears normal, left
sided
Posterior Fossa:       Not well visualized    Abdomen:                Appears normal
Nuchal Fold:           Not applicable (>20    Abdominal Wall:         Appears nml (cord
wks GA)                                        insert, abd wall)
Face:                  Appears normal         Cord Vessels:           Appears normal (3
(orbits and profile)                           vessel cord)
Lips:                  Appears normal         Kidneys:                Appear normal
Palate:                Not well visualized    Bladder:                Appears normal
Thoracic:              Appears normal         Spine:                  Limited views
appear normal
Heart:                 Appears normal         Upper Extremities:      Visualized
(4CH, axis, and situs
RVOT:                  Appears normal         Lower Extremities:      Visualized

Other:  Fetus appears to be a male. Technically difficult due to advanced GA
and fetal position.
Cervix Uterus Adnexa

Cervix
Length:           1.58  cm.
Visualized by transabdominal scan.

Uterus
No abnormality visualized.

Cul De Sac:   No free fluid seen.

Adnexa:       No abnormality visualized.
Impression

Singleton intrauterine pregnancy at 33+0 weeks, admitted
with cervical shortening
Review of the anatomy shows no sonographic markers for
aneuploidy or structural anomalies
However, evaluations should be considered suboptimal
secondary to late gestational age
Amniotic fluid volume is normal with an AFI of 14.9cm
Transabdominal cervical length is 16mm
Estimated fetal weight is 2359g which is growth in the 78th
percentile
Recommendations

Cervical length ceases to be predictive of the risk for preterm
delivery after 28 weeks gestation. Follow-up ultrasounds as
clinically indicated.

## 2018-10-12 ENCOUNTER — Other Ambulatory Visit: Payer: Self-pay

## 2018-10-12 ENCOUNTER — Emergency Department (HOSPITAL_COMMUNITY)
Admission: EM | Admit: 2018-10-12 | Discharge: 2018-10-12 | Disposition: A | Discharge status: Home / Self Care | Payer: 59 | Attending: Emergency Medicine | Admitting: Emergency Medicine

## 2018-10-12 ENCOUNTER — Encounter (HOSPITAL_COMMUNITY): Payer: Self-pay | Admitting: Emergency Medicine

## 2018-10-12 DIAGNOSIS — F0781 Postconcussional syndrome: Secondary | ICD-10-CM

## 2018-10-12 DIAGNOSIS — R531 Weakness: Secondary | ICD-10-CM | POA: Diagnosis not present

## 2018-10-12 DIAGNOSIS — R5383 Other fatigue: Secondary | ICD-10-CM | POA: Diagnosis not present

## 2018-10-12 DIAGNOSIS — R42 Dizziness and giddiness: Secondary | ICD-10-CM | POA: Insufficient documentation

## 2018-10-12 LAB — URINALYSIS, ROUTINE W REFLEX MICROSCOPIC
Bilirubin Urine: NEGATIVE
Glucose, UA: NEGATIVE mg/dL
Hgb urine dipstick: NEGATIVE
Ketones, ur: NEGATIVE mg/dL
Leukocytes, UA: NEGATIVE
Nitrite: NEGATIVE
Protein, ur: NEGATIVE mg/dL
Specific Gravity, Urine: 1.026 (ref 1.005–1.030)
pH: 7 (ref 5.0–8.0)

## 2018-10-12 LAB — CBC WITH DIFFERENTIAL/PLATELET
Abs Immature Granulocytes: 0 K/uL (ref 0.00–0.07)
Basophils Absolute: 0.1 K/uL (ref 0.0–0.1)
Basophils Relative: 1 %
Eosinophils Absolute: 0.1 K/uL (ref 0.0–0.5)
Eosinophils Relative: 2 %
HCT: 39.6 % (ref 36.0–46.0)
Hemoglobin: 12.4 g/dL (ref 12.0–15.0)
Immature Granulocytes: 0 %
Lymphocytes Relative: 40 %
Lymphs Abs: 2.5 K/uL (ref 0.7–4.0)
MCH: 27.7 pg (ref 26.0–34.0)
MCHC: 31.3 g/dL (ref 30.0–36.0)
MCV: 88.6 fL (ref 80.0–100.0)
Monocytes Absolute: 0.5 K/uL (ref 0.1–1.0)
Monocytes Relative: 7 %
Neutro Abs: 3.2 K/uL (ref 1.7–7.7)
Neutrophils Relative %: 50 %
Platelets: 307 K/uL (ref 150–400)
RBC: 4.47 MIL/uL (ref 3.87–5.11)
RDW: 12.6 % (ref 11.5–15.5)
WBC: 6.4 K/uL (ref 4.0–10.5)
nRBC: 0 % (ref 0.0–0.2)

## 2018-10-12 LAB — BASIC METABOLIC PANEL WITH GFR
Anion gap: 9 (ref 5–15)
BUN: 9 mg/dL (ref 6–20)
CO2: 23 mmol/L (ref 22–32)
Calcium: 9.2 mg/dL (ref 8.9–10.3)
Chloride: 108 mmol/L (ref 98–111)
Creatinine, Ser: 0.95 mg/dL (ref 0.44–1.00)
GFR calc Af Amer: >60 mL/min
GFR calc non Af Amer: >60 mL/min
Glucose, Bld: 99 mg/dL (ref 70–99)
Potassium: 3.8 mmol/L (ref 3.5–5.1)
Sodium: 140 mmol/L (ref 135–145)

## 2018-10-12 LAB — I-STAT BETA HCG BLOOD, ED (MC, WL, AP ONLY): I-stat hCG, quantitative: <5 m[IU]/mL
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
## 2018-10-12 NOTE — Discharge Instructions (Addendum)
If symptoms do not improve in 1-2 weeks would follow up with neurology. If headaches occur you can take your home migraine medication. Return to the ED if you experience severe worsening of your symptoms, change in vision, vomiting, weakness in an extremity.

## 2018-10-12 NOTE — ED Notes (Signed)
Pt verbalized understanding of discharge paperwork and follow-up care.  °
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
## 2019-08-10 MED FILL — valACYclovir HCL 1 GM TABS: 1 | 30 days supply | Qty: 30 | Fill #0

## 2019-10-09 MED FILL — valACYclovir HCL 1 GM TABS: 1 | 30 days supply | Qty: 30 | Fill #1

## 2019-12-20 MED FILL — valACYclovir HCL 1 GM TABS: 1 | 30 days supply | Qty: 30 | Fill #2

## 2019-12-28 ENCOUNTER — Other Ambulatory Visit (HOSPITAL_COMMUNITY)
Admission: RE | Admit: 2019-12-28 | Discharge: 2019-12-28 | Disposition: A | Discharge status: Home / Self Care | Payer: No Typology Code available for payment source | Source: Ambulatory Visit | Attending: Obstetrics and Gynecology | Admitting: Obstetrics and Gynecology

## 2019-12-28 ENCOUNTER — Ambulatory Visit (INDEPENDENT_AMBULATORY_CARE_PROVIDER_SITE_OTHER): Payer: No Typology Code available for payment source | Admitting: Obstetrics and Gynecology

## 2019-12-28 ENCOUNTER — Other Ambulatory Visit: Payer: Self-pay

## 2019-12-28 ENCOUNTER — Encounter: Payer: Self-pay | Admitting: Obstetrics and Gynecology

## 2019-12-28 VITALS — BP 110/72 | HR 70 | Temp 97.0°F | Resp 14 | Ht 65.0 in | Wt 164.4 lb

## 2019-12-28 DIAGNOSIS — N760 Acute vaginitis: Secondary | ICD-10-CM

## 2019-12-28 DIAGNOSIS — N911 Secondary amenorrhea: Secondary | ICD-10-CM | POA: Diagnosis not present

## 2019-12-28 DIAGNOSIS — Z113 Encounter for screening for infections with a predominantly sexual mode of transmission: Secondary | ICD-10-CM

## 2019-12-28 DIAGNOSIS — Z01419 Encounter for gynecological examination (general) (routine) without abnormal findings: Secondary | ICD-10-CM | POA: Diagnosis not present

## 2019-12-28 DIAGNOSIS — Z3042 Encounter for surveillance of injectable contraceptive: Secondary | ICD-10-CM | POA: Diagnosis not present

## 2019-12-28 MED ORDER — MEDROXYPROGESTERONE ACETATE 150 MG/ML IM SUSP
150.0000 mg | Freq: Once | INTRAMUSCULAR | Status: AC
  Administered 2019-12-28: 150 mg via INTRAMUSCULAR

## 2019-12-28 NOTE — Progress Notes (Signed)
GYNECOLOGY  VISIT   HPI: 29 y.o.   Single  African American  female   [id number] with No LMP recorded. Patient has had an injection.   here for vaginal discharge with odor for 6 weeks.   Patient states no menses since stopping Depo Provera 06/2019. Started Depo after she completed breastfeeding in 2010 but stopped in 06/2019.   She was due in December, 2020 for her Depo Provera. She was experiencing weight gain, headache, and decreased sex drive.  She needs contraception and wants to go back on Depo Provera.  Last intercourse was one month ago.  She notices some irritation.  She wonders if she has BV. She has been douching and the odor got worse.   She desires STD testing today.   UPT: negative.   Work for [employer] in [employer].  Has a three year old son.   GYNECOLOGIC HISTORY: No LMP recorded. Patient has had an injection. Contraception:  None Menopausal hormone therapy:  n/a Last mammogram:  n/a Last pap smear:   2019 normal per patient at [facility]--never had abnormal pap Gardasil:  Completed.        OB History    Gravida  2   Para  1   Term  1   Preterm      AB  1   Living  1     SAB  0   TAB  1   Ectopic      Multiple  0   Live Births  1              Patient Active Problem List   Diagnosis Date Noted  . Normal labor 10/09/2016  . Premature cervical dilation in third trimester 08/31/2016    Past Medical History:  Diagnosis Date  . Depression    Postpartum depression  . Dysmenorrhea   . Headache   . Herpes    no outbreaks, +blood test  . Infection    uti  . 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    Past Surgical History:  Procedure Laterality Date  . MULTIPLE TOOTH EXTRACTIONS      Current Outpatient Medications  Medication Sig Dispense Refill  . valACYclovir (VALTREX) 1000 MG tablet Take 1,000 mg by mouth 2 (two) times daily.     No current facility-administered medications for this  visit.     ALLERGIES: Patient has no known allergies.  Family History  Problem Relation Age of Onset  . Diabetes Maternal Grandmother   . Crohn's disease Maternal Grandmother   . Fibromyalgia Maternal Grandmother   . Heart disease Maternal Grandmother   . Asthma Maternal Grandmother   . Stroke Maternal Grandmother   . Hypertension Maternal Grandmother   . Cancer Maternal Grandfather   . Asthma Sister   . Heart disease Sister   . Asthma Brother     Social History   Socioeconomic History  . Marital status: Single    Spouse name: Not on file  . Number of children: Not on file  . Years of education: Not on file  . Highest education level: Not on file  Occupational History  . Not on file  Tobacco Use  . Smoking status: Never Smoker  . Smokeless tobacco: Never Used  Substance and Sexual Activity  . Alcohol use: Yes    Alcohol/week: 2.0 standard drinks    Types: 2 Glasses of wine per week    Comment: socially  .  Drug use: No  . Sexual activity: Yes    Birth control/protection: None  Other Topics Concern  . Not on file  Social History Narrative  . Not on file   Social Determinants of Health   Financial Resource Strain:   . Difficulty of Paying Living Expenses: Not on file  Food Insecurity:   . Worried About Programme researcher, broadcasting/film/video in the Last Year: Not on file  . Ran Out of Food in the Last Year: Not on file  Transportation Needs:   . Lack of Transportation (Medical): Not on file  . Lack of Transportation (Non-Medical): Not on file  Physical Activity:   . Days of Exercise per Week: Not on file  . Minutes of Exercise per Session: Not on file  Stress:   . Feeling of Stress : Not on file  Social Connections:   . Frequency of Communication with Friends and Family: Not on file  . Frequency of Social Gatherings with Friends and Family: Not on file  . Attends Religious Services: Not on file  . Active Member of Clubs or Organizations: Not on file  . Attends Tax inspector Meetings: Not on file  . Marital Status: Not on file  Intimate Partner Violence:   . Fear of Current or Ex-Partner: Not on file  . Emotionally Abused: Not on file  . Physically Abused: Not on file  . Sexually Abused: Not on file    Review of Systems  All other systems reviewed and are negative.   PHYSICAL EXAMINATION:    BP 110/72   Pulse 70   Temp (!) 97 F (36.1 C) (Temporal)   Resp 14   Ht 5\' 5"  (1.651 m)   Wt 164 lb 6.4 oz (74.6 kg)   Breastfeeding No   BMI 27.36 kg/m     General appearance: alert, cooperative and appears stated age Head: Normocephalic, without obvious abnormality, atraumatic Neck: no adenopathy, supple, symmetrical, trachea midline and thyroid normal to inspection and palpation Lungs: clear to auscultation bilaterally Breasts: normal appearance, no masses or tenderness, No nipple retraction or dimpling, No nipple discharge or bleeding, No axillary or supraclavicular adenopathy Heart: regular rate and rhythm Abdomen: soft, non-tender, no masses,  no organomegaly Extremities: extremities normal, atraumatic, no cyanosis or edema Skin: Skin color, texture, turgor normal. No rashes or lesions Lymph nodes: Cervical, supraclavicular, and axillary nodes normal. No abnormal inguinal nodes palpated Neurologic: Grossly normal  Pelvic: External genitalia:  no lesions              Urethra:  normal appearing urethra with no masses, tenderness or lesions              Bartholins and Skenes: normal                 Vagina: normal appearing vagina with normal color and yellow somewhat clumpy discharge, no lesions              Cervix: no lesions.  No pap.                 Bimanual Exam:  Uterus:  normal size, contour, position, consistency, mobility, non-tender              Adnexa: no mass, fullness, tenderness          Chaperone was present for exam.  ASSESSMENT  Well woman with GYN examination.  Amenorrhea post discontinuation of Depo Provera.   Vaginitis.  Hx HSV.  PLAN  Will check  TSH and prolactin.  STD screening.  Restart Depo Provera 150 mg IM q 3 months x 1 year.  I recommended calcium and vitamin D.  Mammogram at age 33.  Breast awareness encouraged.  Pap in 2022. FU annually and prn.    An After Visit Summary was printed and given to the patient.

## 2019-12-28 NOTE — Patient Instructions (Signed)

## 2019-12-28 NOTE — Addendum Note (Signed)
Addended by: Isabell Jarvis on: 12/28/2019 10:15 AM   Modules accepted: Orders

## 2019-12-29 ENCOUNTER — Telehealth: Payer: Self-pay | Admitting: Obstetrics and Gynecology

## 2019-12-29 DIAGNOSIS — N760 Acute vaginitis: Secondary | ICD-10-CM

## 2019-12-29 LAB — CERVICOVAGINAL ANCILLARY ONLY
Bacterial Vaginitis (gardnerella): POSITIVE — AB
Candida Glabrata: NEGATIVE
Candida Vaginitis: POSITIVE — AB
Chlamydia: NEGATIVE
Comment: NEGATIVE
Comment: NEGATIVE
Comment: NEGATIVE
Comment: NEGATIVE
Comment: NEGATIVE
Comment: NORMAL
Neisseria Gonorrhea: NEGATIVE
Trichomonas: NEGATIVE

## 2019-12-29 LAB — HEP, RPR, HIV PANEL
HIV Screen 4th Generation wRfx: NONREACTIVE
Hepatitis B Surface Ag: NEGATIVE
RPR Ser Ql: NONREACTIVE

## 2019-12-29 LAB — PROLACTIN: Prolactin: 11.5 ng/mL (ref 4.8–23.3)

## 2019-12-29 LAB — HEPATITIS C ANTIBODY: Hep C Virus Ab: <0.1 s/co ratio (ref 0.0–0.9)

## 2019-12-29 LAB — TSH: TSH: 0.814 u[IU]/mL (ref 0.450–4.500)

## 2019-12-29 MED ORDER — METRONIDAZOLE 500 MG PO TABS: 500.0000 mg | ORAL_TABLET | Freq: Two times a day (BID) | ORAL | 0 refills | Status: DC

## 2019-12-29 MED ORDER — FLUCONAZOLE 150 MG PO TABS: ORAL_TABLET | ORAL | 0 refills | Status: DC

## 2019-12-29 MED FILL — FLUCONAZOLE 150 MG TABS: 150 | 3 days supply | Qty: 2 | Fill #0

## 2019-12-29 MED FILL — metroNIDAZOLE 500 MG TABS: 500 | 7 days supply | Qty: 14 | Fill #0

## 2019-12-29 NOTE — Telephone Encounter (Signed)
Spoke to pt. Pt given lab results. Pt verbalized understanding of positive BV and yeast infection. Rx for Flagyl 500 mg BID #14, 0RF and Rx for Diflucan 150mg  #2, 0RF sent to pharmacy on file for treatment. Pt given ETOH precautions. Pt agreeable and verbalized understanding.   Routing to Dr for review and any additional recommendations.

## 2019-12-29 NOTE — Telephone Encounter (Signed)
Patient calling to go over results seen on Mychart.

## 2019-12-30 NOTE — Telephone Encounter (Signed)
I recommend Flagyl and Diflucan.  I signed the orders.

## 2020-01-01 NOTE — Telephone Encounter (Signed)
Encounter closed

## 2020-02-26 ENCOUNTER — Telehealth: Payer: Self-pay

## 2020-02-26 MED FILL — valACYclovir HCL 1 GM TABS: 1 | 30 days supply | Qty: 30 | Fill #3

## 2020-02-26 NOTE — Telephone Encounter (Signed)
Patient is calling in regards to medication VALTREX. Patient states she was prescribed this medication at previous gyn and would like to know if she needs an appointment to come in to get it prescribed from our office.

## 2020-02-26 NOTE — Telephone Encounter (Signed)
AEX 12/28/19 as New GYN   Spoke with pt. Pt states needing new Rx written by Dr Edward Jolly now since being a new pt on 12/28/19.  Pt has Hx of HSV 2. Takes Valtrex 1gm as needed for outbreaks.   Pt states called pharmacy and had 1 RF left from last Rx from Dr Pecola Leisure  and had it filled today. Pt states wanting Dr Edward Jolly to send in new Rx for Valtrex for future RF until next AEX. Pt reports no current outbreak, just likes to have Rx on hand for outbreaks.   Pt advised will review with Dr Edward Jolly and return call to pt with any recommendations or advice on Rx. Pt agreeable.   Routing to Dr Edward Jolly.

## 2020-02-27 NOTE — Telephone Encounter (Signed)
Patient will need to come in for an office with me and blood work to check her kidney function if I will be taking over the Valtrex prescription.

## 2020-02-27 NOTE — Telephone Encounter (Signed)
Left message for pt to return call to triage RN. 

## 2020-03-01 NOTE — Telephone Encounter (Signed)
Spoke with pt. Pt given recommendations per Dr Edward Jolly. Pt agreeable and verbalized understanding. Pt scheduled for OV to discuss Rx refill and labs on 03/11/20 at 3pm. Pt agreeable to date and time. Pt states is not out of Rx, but will need one for future.   Routing to Dr Edward Jolly for final review.  Encounter closed.

## 2020-03-07 NOTE — Progress Notes (Deleted)
GYNECOLOGY  VISIT   HPI: 29 y.o.   Single  African American  female   [id number] with No LMP recorded. Patient has had an injection.   here for consult regarding Valtrex Rx.    GYNECOLOGIC HISTORY: No LMP recorded. Patient has had an injection. Contraception: Depo Provera Menopausal hormone therapy:  n/a Last mammogram:  n/a Last pap smear: 2019 normal per patient at [facility]--never had abnormal pap        OB History    Gravida  2   Para  1   Term  1   Preterm      AB  1   Living  1     SAB  0   TAB  1   Ectopic      Multiple  0   Live Births  1              Patient Active Problem List   Diagnosis Date Noted  . Normal labor 10/09/2016  . Premature cervical dilation in third trimester 08/31/2016    Past Medical History:  Diagnosis Date  . Depression    Postpartum depression  . Dysmenorrhea   . Headache   . Herpes    no outbreaks, +blood test  . Infection    uti  . 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    Past Surgical History:  Procedure Laterality Date  . MULTIPLE TOOTH EXTRACTIONS      Current Outpatient Medications  Medication Sig Dispense Refill  . fluconazole (DIFLUCAN) 150 MG tablet Take 1 tablet today and may repeat in 72 hours if needed. 2 tablet 0  . metroNIDAZOLE (FLAGYL) 500 MG tablet Take 1 tablet (500 mg total) by mouth 2 (two) times daily. 14 tablet 0  . valACYclovir (VALTREX) 1000 MG tablet Take 1,000 mg by mouth 2 (two) times daily.     No current facility-administered medications for this visit.     ALLERGIES: Patient has no known allergies.  Family History  Problem Relation Age of Onset  . Diabetes Maternal Grandmother   . Crohn's disease Maternal Grandmother   . Fibromyalgia Maternal Grandmother   . Heart disease Maternal Grandmother   . Asthma Maternal Grandmother   . Stroke Maternal Grandmother   . Hypertension Maternal Grandmother   . Cancer Maternal Grandfather   .  Asthma Sister   . Heart disease Sister   . Asthma Brother     Social History   Socioeconomic History  . Marital status: Single    Spouse name: Not on file  . Number of children: Not on file  . Years of education: Not on file  . Highest education level: Not on file  Occupational History  . Not on file  Tobacco Use  . Smoking status: Never Smoker  . Smokeless tobacco: Never Used  Substance and Sexual Activity  . Alcohol use: Yes    Alcohol/week: 2.0 standard drinks    Types: 2 Glasses of wine per week    Comment: socially  . Drug use: No  . Sexual activity: Yes    Birth control/protection: None  Other Topics Concern  . Not on file  Social History Narrative  . Not on file   Social Determinants of Health   Financial Resource Strain:   . Difficulty of Paying Living Expenses:   Food Insecurity:   . Worried About Charity fundraiser in the Last Year:   . [employer]  in the Last Year:   Transportation Needs:   . Freight forwarder (Medical):   Marland Kitchen Lack of Transportation (Non-Medical):   Physical Activity:   . Days of Exercise per Week:   . Minutes of Exercise per Session:   Stress:   . Feeling of Stress :   Social Connections:   . Frequency of Communication with Friends and Family:   . Frequency of Social Gatherings with Friends and Family:   . Attends Religious Services:   . Active Member of Clubs or Organizations:   . Attends Banker Meetings:   Marland Kitchen Marital Status:   Intimate Partner Violence:   . Fear of Current or Ex-Partner:   . Emotionally Abused:   Marland Kitchen Physically Abused:   . Sexually Abused:     Review of Systems  PHYSICAL EXAMINATION:    There were no vitals taken for this visit.    General appearance: alert, cooperative and appears stated age Head: Normocephalic, without obvious abnormality, atraumatic Neck: no adenopathy, supple, symmetrical, trachea midline and thyroid normal to inspection and palpation Lungs: clear to auscultation  bilaterally Breasts: normal appearance, no masses or tenderness, No nipple retraction or dimpling, No nipple discharge or bleeding, No axillary or supraclavicular adenopathy Heart: regular rate and rhythm Abdomen: soft, non-tender, no masses,  no organomegaly Extremities: extremities normal, atraumatic, no cyanosis or edema Skin: Skin color, texture, turgor normal. No rashes or lesions Lymph nodes: Cervical, supraclavicular, and axillary nodes normal. No abnormal inguinal nodes palpated Neurologic: Grossly normal  Pelvic: External genitalia:  no lesions              Urethra:  normal appearing urethra with no masses, tenderness or lesions              Bartholins and Skenes: normal                 Vagina: normal appearing vagina with normal color and discharge, no lesions              Cervix: no lesions                Bimanual Exam:  Uterus:  normal size, contour, position, consistency, mobility, non-tender              Adnexa: no mass, fullness, tenderness              Rectal exam: {yes no:314532}.  Confirms.              Anus:  normal sphincter tone, no lesions  Chaperone was present for exam.  ASSESSMENT     PLAN     An After Visit Summary was printed and given to the patient.  ______ minutes face to face time of which over 50% was spent in counseling.

## 2020-03-11 ENCOUNTER — Ambulatory Visit: Payer: Self-pay | Admitting: Obstetrics and Gynecology

## 2020-03-11 ENCOUNTER — Encounter: Payer: Self-pay | Admitting: Obstetrics and Gynecology

## 2020-03-14 ENCOUNTER — Ambulatory Visit: Payer: No Typology Code available for payment source

## 2020-03-18 ENCOUNTER — Telehealth: Payer: Self-pay | Admitting: Obstetrics and Gynecology

## 2020-03-18 ENCOUNTER — Ambulatory Visit: Payer: No Typology Code available for payment source

## 2020-03-18 NOTE — Progress Notes (Deleted)
Patient is here for Depo Provera Injection Patient is within Depo Provera Calender Limits yes Next Depo Due between: 8/9-8/23 Last AEX: 12/28/19 Dr. Jose Persia Scheduled: 02/05/21  Patient is aware when next depo is due  Pt tolerated Injection well in LUOQ.  Routed to provider for review, encounter closed.

## 2020-03-18 NOTE — Telephone Encounter (Signed)
Patient dnka her depo provera appointment today. I left her a message to call and reschedule.  °

## 2020-03-19 NOTE — Telephone Encounter (Signed)
Thank you for the update!

## 2020-03-27 NOTE — Progress Notes (Deleted)
GYNECOLOGY  VISIT   HPI: 29 y.o.   Single  {Race/ethnicity:[id number]}  female   [id number] with No LMP recorded. Patient has had an injection.   here for     GYNECOLOGIC HISTORY: No LMP recorded. Patient has had an injection. Contraception:  *** Menopausal hormone therapy:  *** Last mammogram:  *** Last pap smear:   ***        OB History    Gravida  2   Para  1   Term  1   Preterm      AB  1   Living  1     SAB  0   TAB  1   Ectopic      Multiple  0   Live Births  1              Patient Active Problem List   Diagnosis Date Noted  . Normal labor 10/09/2016  . Premature cervical dilation in third trimester 08/31/2016    Past Medical History:  Diagnosis Date  . Depression    Postpartum depression  . Dysmenorrhea   . Headache   . Herpes    no outbreaks, +blood test  . Infection    uti  . 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    Past Surgical History:  Procedure Laterality Date  . MULTIPLE TOOTH EXTRACTIONS      Current Outpatient Medications  Medication Sig Dispense Refill  . fluconazole (DIFLUCAN) 150 MG tablet Take 1 tablet today and may repeat in 72 hours if needed. 2 tablet 0  . metroNIDAZOLE (FLAGYL) 500 MG tablet Take 1 tablet (500 mg total) by mouth 2 (two) times daily. 14 tablet 0  . valACYclovir (VALTREX) 1000 MG tablet Take 1,000 mg by mouth 2 (two) times daily.     No current facility-administered medications for this visit.     ALLERGIES: Patient has no known allergies.  Family History  Problem Relation Age of Onset  . Diabetes Maternal Grandmother   . Crohn's disease Maternal Grandmother   . Fibromyalgia Maternal Grandmother   . Heart disease Maternal Grandmother   . Asthma Maternal Grandmother   . Stroke Maternal Grandmother   . Hypertension Maternal Grandmother   . Cancer Maternal Grandfather   . Asthma Sister   . Heart disease Sister   . Asthma Brother     Social History    Socioeconomic History  . Marital status: Single    Spouse name: Not on file  . Number of children: Not on file  . Years of education: Not on file  . Highest education level: Not on file  Occupational History  . Not on file  Tobacco Use  . Smoking status: Never Smoker  . Smokeless tobacco: Never Used  Substance and Sexual Activity  . Alcohol use: Yes    Alcohol/week: 2.0 standard drinks    Types: 2 Glasses of wine per week    Comment: socially  . Drug use: No  . Sexual activity: Yes    Birth control/protection: None  Other Topics Concern  . Not on file  Social History Narrative  . Not on file   Social Determinants of Health   Financial Resource Strain:   . Difficulty of Paying Living Expenses:   Food Insecurity:   . Worried About Charity fundraiser in the Last Year:   . Arboriculturist in the Last Year:   Transportation Needs:   .  Lack of Transportation (Medical):   Marland Kitchen Lack of Transportation (Non-Medical):   Physical Activity:   . Days of Exercise per Week:   . Minutes of Exercise per Session:   Stress:   . Feeling of Stress :   Social Connections:   . Frequency of Communication with Friends and Family:   . Frequency of Social Gatherings with Friends and Family:   . Attends Religious Services:   . Active Member of Clubs or Organizations:   . Attends Banker Meetings:   Marland Kitchen Marital Status:   Intimate Partner Violence:   . Fear of Current or Ex-Partner:   . Emotionally Abused:   Marland Kitchen Physically Abused:   . Sexually Abused:     Review of Systems  PHYSICAL EXAMINATION:    There were no vitals taken for this visit.    General appearance: alert, cooperative and appears stated age Head: Normocephalic, without obvious abnormality, atraumatic Neck: no adenopathy, supple, symmetrical, trachea midline and thyroid normal to inspection and palpation Lungs: clear to auscultation bilaterally Breasts: normal appearance, no masses or tenderness, No nipple  retraction or dimpling, No nipple discharge or bleeding, No axillary or supraclavicular adenopathy Heart: regular rate and rhythm Abdomen: soft, non-tender, no masses,  no organomegaly Extremities: extremities normal, atraumatic, no cyanosis or edema Skin: Skin color, texture, turgor normal. No rashes or lesions Lymph nodes: Cervical, supraclavicular, and axillary nodes normal. No abnormal inguinal nodes palpated Neurologic: Grossly normal  Pelvic: External genitalia:  no lesions              Urethra:  normal appearing urethra with no masses, tenderness or lesions              Bartholins and Skenes: normal                 Vagina: normal appearing vagina with normal color and discharge, no lesions              Cervix: no lesions                Bimanual Exam:  Uterus:  normal size, contour, position, consistency, mobility, non-tender              Adnexa: no mass, fullness, tenderness              Rectal exam: {yes no:314532}.  Confirms.              Anus:  normal sphincter tone, no lesions  Chaperone was present for exam.  ASSESSMENT     PLAN     An After Visit Summary was printed and given to the patient.  ______ minutes face to face time of which over 50% was spent in counseling.

## 2020-03-29 ENCOUNTER — Ambulatory Visit: Payer: Self-pay | Admitting: Obstetrics and Gynecology

## 2020-03-29 ENCOUNTER — Encounter: Payer: Self-pay | Admitting: Obstetrics and Gynecology

## 2020-04-05 ENCOUNTER — Telehealth: Payer: Self-pay

## 2020-04-05 NOTE — Telephone Encounter (Signed)
AEX 12/28/19 as New GYN   Spoke with pt. Pt states needing reschedule of HSV consult with possible labs for kidney function and Depo injection. Pt DNKA for depo on 03/18/20. Pt did not call until today for reschedule. Depo due last on 03/28/20.  Pt advised will need OV for Depo injection and have UPT prior. Pt agreeable.  Pt states not having any bleeding x 3 years after having son and being on Depo series. Pt denies any other problems or concerns at this time.   Pt scheduled with Dr Edward Jolly on 6/15 at 3:15 pm. Pt verbalized understanding and agreeable. CPS neg.   Routing to Dr Edward Jolly for review.  Encounter closed.  See phone encounter dated 02/26/20 for more details.

## 2020-04-05 NOTE — Telephone Encounter (Signed)
Patient returning call.

## 2020-04-05 NOTE — Telephone Encounter (Signed)
Left message for pt to return call to triage RN. 

## 2020-04-05 NOTE — Telephone Encounter (Signed)
Patient would like to reschedule missed appointments for Depo and Labs.

## 2020-04-08 ENCOUNTER — Other Ambulatory Visit: Payer: Self-pay

## 2020-04-09 ENCOUNTER — Ambulatory Visit: Payer: No Typology Code available for payment source | Admitting: Obstetrics and Gynecology

## 2020-04-09 ENCOUNTER — Telehealth: Payer: Self-pay

## 2020-04-09 NOTE — Telephone Encounter (Signed)
Left message for pt to return call to triage RN. 

## 2020-04-09 NOTE — Progress Notes (Deleted)
GYNECOLOGY  VISIT   HPI: 29 y.o.   Single  African American  female   [id number] with No LMP recorded. Patient has had an injection.   here for consult regarding HSV.  Patient also needs Depo Provera, she was due the end of May/1st of June.  UPT:  GYNECOLOGIC HISTORY: No LMP recorded. Patient has had an injection. Contraception: Depo Provera Menopausal hormone therapy:  n/a Last mammogram:  n/a Last pap smear: 2019 normal per [facility] at [facility]--never had abnormal pap        OB History    Gravida  2   Para  1   Term  1   Preterm      AB  1   Living  1     SAB  0   TAB  1   Ectopic      Multiple  0   Live Births  1              Patient Active Problem List   Diagnosis Date Noted  . Normal labor 10/09/2016  . Premature cervical dilation in third trimester 08/31/2016    Past Medical History:  Diagnosis Date  . Depression    Postpartum depression  . Dysmenorrhea   . Headache   . Herpes    no outbreaks, +blood test  . Infection    uti  . 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    Past Surgical History:  Procedure Laterality Date  . MULTIPLE TOOTH EXTRACTIONS      Current Outpatient Medications  Medication Sig Dispense Refill  . fluconazole (DIFLUCAN) 150 MG tablet Take 1 tablet today and may repeat in 72 hours if needed. 2 tablet 0  . metroNIDAZOLE (FLAGYL) 500 MG tablet Take 1 tablet (500 mg total) by mouth 2 (two) times daily. 14 tablet 0  . valACYclovir (VALTREX) 1000 MG tablet Take 1,000 mg by mouth 2 (two) times daily.     No current facility-administered medications for this visit.     ALLERGIES: Patient has no known allergies.  Family History  Problem Relation Age of Onset  . Diabetes Maternal Grandmother   . Crohn's disease Maternal Grandmother   . Fibromyalgia Maternal Grandmother   . Heart disease Maternal Grandmother   . Asthma Maternal Grandmother   . Stroke Maternal Grandmother   .  Hypertension Maternal Grandmother   . Cancer Maternal Grandfather   . Asthma Sister   . Heart disease Sister   . Asthma Brother     Social History   Socioeconomic History  . Marital status: Single    Spouse name: Not on file  . Number of children: Not on file  . Years of education: Not on file  . Highest education level: Not on file  Occupational History  . Not on file  Tobacco Use  . Smoking status: Never Smoker  . Smokeless tobacco: Never Used  Vaping Use  . Vaping Use: Never used  Substance and Sexual Activity  . Alcohol use: Yes    Alcohol/week: 2.0 standard drinks    Types: 2 Glasses of wine per week    Comment: socially  . Drug use: No  . Sexual activity: Yes    Birth control/protection: None  Other Topics Concern  . Not on file  Social History Narrative  . Not on file   Social Determinants of Health   Financial Resource Strain:   . Difficulty of Paying Living Expenses:  Food Insecurity:   . Worried About Programme researcher, broadcasting/film/video in the Last Year:   . Barista in the Last Year:   Transportation Needs:   . Freight forwarder (Medical):   Marland Kitchen Lack of Transportation (Non-Medical):   Physical Activity:   . Days of Exercise per Week:   . Minutes of Exercise per Session:   Stress:   . Feeling of Stress :   Social Connections:   . Frequency of Communication with Friends and Family:   . Frequency of Social Gatherings with Friends and Family:   . Attends Religious Services:   . Active Member of Clubs or Organizations:   . Attends Banker Meetings:   Marland Kitchen Marital Status:   Intimate Partner Violence:   . Fear of Current or Ex-Partner:   . Emotionally Abused:   Marland Kitchen Physically Abused:   . Sexually Abused:     Review of Systems  PHYSICAL EXAMINATION:    There were no vitals taken for this visit.    General appearance: alert, cooperative and appears stated age Head: Normocephalic, without obvious abnormality, atraumatic Neck: no adenopathy,  supple, symmetrical, trachea midline and thyroid normal to inspection and palpation Lungs: clear to auscultation bilaterally Breasts: normal appearance, no masses or tenderness, No nipple retraction or dimpling, No nipple discharge or bleeding, No axillary or supraclavicular adenopathy Heart: regular rate and rhythm Abdomen: soft, non-tender, no masses,  no organomegaly Extremities: extremities normal, atraumatic, no cyanosis or edema Skin: Skin color, texture, turgor normal. No rashes or lesions Lymph nodes: Cervical, supraclavicular, and axillary nodes normal. No abnormal inguinal nodes palpated Neurologic: Grossly normal  Pelvic: External genitalia:  no lesions              Urethra:  normal appearing urethra with no masses, tenderness or lesions              Bartholins and Skenes: normal                 Vagina: normal appearing vagina with normal color and discharge, no lesions              Cervix: no lesions                Bimanual Exam:  Uterus:  normal size, contour, position, consistency, mobility, non-tender              Adnexa: no mass, fullness, tenderness              Rectal exam: {yes no:314532}.  Confirms.              Anus:  normal sphincter tone, no lesions  Chaperone was present for exam.  ASSESSMENT     PLAN     An After Visit Summary was printed and given to the patient.  ______ minutes face to face time of which over 50% was spent in counseling.

## 2020-04-09 NOTE — Telephone Encounter (Signed)
Patient canceled appointment for today and would like to reschedule. Need triage to assist in rescheduling.

## 2020-04-12 NOTE — Telephone Encounter (Signed)
Left message for pt to return call to triage RN. 

## 2020-04-16 NOTE — Progress Notes (Deleted)
GYNECOLOGY  VISIT   HPI: 29 y.o.   Single  African American  female   G2P1011 with No LMP recorded. Patient has had an injection.   here for consult regarding HSV and refill on Valtrex. She also needs Depo Provera injection--this was due 03-28-20.    GYNECOLOGIC HISTORY: No LMP recorded. Patient has had an injection. Contraception:  *** Menopausal hormone therapy:  n/a Last mammogram:  n/a Last pap smear:  2019 normal [facility] patient at [facility]--never had abnormal pap        OB History    Gravida  2   Para  1   Term  1   Preterm      AB  1   Living  1     SAB  0   TAB  1   Ectopic      Multiple  0   Live Births  1              Patient Active Problem List   Diagnosis Date Noted  . Normal labor 10/09/2016  . Premature cervical dilation in third trimester 08/31/2016    Past Medical History:  Diagnosis Date  . Depression    Postpartum depression  . Dysmenorrhea   . Headache   . Herpes    no outbreaks, +blood test  . Infection    uti  . 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    Past Surgical History:  Procedure Laterality Date  . MULTIPLE TOOTH EXTRACTIONS      Current Outpatient Medications  Medication Sig Dispense Refill  . fluconazole (DIFLUCAN) 150 MG tablet Take 1 tablet today and may repeat in 72 hours if needed. 2 tablet 0  . metroNIDAZOLE (FLAGYL) 500 MG tablet Take 1 tablet (500 mg total) by mouth 2 (two) times daily. 14 tablet 0  . valACYclovir (VALTREX) 1000 MG tablet Take 1,000 mg by mouth 2 (two) times daily.     No current facility-administered medications for this visit.     ALLERGIES: Patient has no known allergies.  Family History  Problem Relation Age of Onset  . Diabetes Maternal Grandmother   . Crohn's disease Maternal Grandmother   . Fibromyalgia Maternal Grandmother   . Heart disease Maternal Grandmother   . Asthma Maternal Grandmother   . Stroke Maternal Grandmother   .  Hypertension Maternal Grandmother   . Cancer Maternal Grandfather   . Asthma Sister   . Heart disease Sister   . Asthma Brother     Social History   Socioeconomic History  . Marital status: Single    Spouse name: Not on file  . Number of children: Not on file  . Years of education: Not on file  . Highest education level: Not on file  Occupational History  . Not on file  Tobacco Use  . Smoking status: Never Smoker  . Smokeless tobacco: Never Used  Vaping Use  . Vaping Use: Never used  Substance and Sexual Activity  . Alcohol use: Yes    Alcohol/week: 2.0 standard drinks    Types: 2 Glasses of wine per week    Comment: socially  . Drug use: No  . Sexual activity: Yes    Birth control/protection: None  Other Topics Concern  . Not on file  Social History Narrative  . Not on file   Social Determinants of Health   Financial Resource Strain:   . Difficulty of Paying Living Expenses:  Food Insecurity:   . Worried About Programme researcher, broadcasting/film/video in the Last Year:   . Barista in the Last Year:   Transportation Needs:   . Freight forwarder (Medical):   Marland Kitchen Lack of Transportation (Non-Medical):   Physical Activity:   . Days of Exercise per Week:   . Minutes of Exercise per Session:   Stress:   . Feeling of Stress :   Social Connections:   . Frequency of Communication with Friends and Family:   . Frequency of Social Gatherings with Friends and Family:   . Attends Religious Services:   . Active Member of Clubs or Organizations:   . Attends Banker Meetings:   Marland Kitchen Marital Status:   Intimate Partner Violence:   . Fear of Current or Ex-Partner:   . Emotionally Abused:   Marland Kitchen Physically Abused:   . Sexually Abused:     Review of Systems  PHYSICAL EXAMINATION:    There were no vitals taken for this visit.    General appearance: alert, cooperative and appears stated age Head: Normocephalic, without obvious abnormality, atraumatic Neck: no adenopathy,  supple, symmetrical, trachea midline and thyroid normal to inspection and palpation Lungs: clear to auscultation bilaterally Breasts: normal appearance, no masses or tenderness, No nipple retraction or dimpling, No nipple discharge or bleeding, No axillary or supraclavicular adenopathy Heart: regular rate and rhythm Abdomen: soft, non-tender, no masses,  no organomegaly Extremities: extremities normal, atraumatic, no cyanosis or edema Skin: Skin color, texture, turgor normal. No rashes or lesions Lymph nodes: Cervical, supraclavicular, and axillary nodes normal. No abnormal inguinal nodes palpated Neurologic: Grossly normal  Pelvic: External genitalia:  no lesions              Urethra:  normal appearing urethra with no masses, tenderness or lesions              Bartholins and Skenes: normal                 Vagina: normal appearing vagina with normal color and discharge, no lesions              Cervix: no lesions                Bimanual Exam:  Uterus:  normal size, contour, position, consistency, mobility, non-tender              Adnexa: no mass, fullness, tenderness              Rectal exam: {yes no:314532}.  Confirms.              Anus:  normal sphincter tone, no lesions  Chaperone was present for exam.  ASSESSMENT     PLAN     An After Visit Summary was printed and given to the patient.  ______ minutes face to face time of which over 50% was spent in counseling.

## 2020-04-16 NOTE — Telephone Encounter (Signed)
Spoke with pt. Pt had cancelled appt on 04/09/20 for HSV consult and to receive Depo injection. Pt states not having any problems or concerns at this time. Pt's last Depo was 12/28/19. Denies any vaginal bleeding.   Pt scheduled for 6/23 at 2 pm with Dr Edward Jolly. Pt agreeable and verbalized understanding of date and time of appt. CPS neg.  Routing to Dr Edward Jolly for review.  Encounter closed.

## 2020-04-17 ENCOUNTER — Ambulatory Visit: Payer: No Typology Code available for payment source | Admitting: Obstetrics and Gynecology

## 2020-04-17 ENCOUNTER — Encounter: Payer: Self-pay | Admitting: Obstetrics and Gynecology

## 2020-05-23 MED FILL — valACYclovir HCL 1 GM TABS: 1 | 30 days supply | Qty: 30 | Fill #4

## 2020-05-31 MED FILL — valACYclovir HCL 1 GM TABS: 1 | 30 days supply | Qty: 30 | Fill #4

## 2020-07-03 ENCOUNTER — Telehealth: Payer: Self-pay

## 2020-07-03 NOTE — Telephone Encounter (Signed)
Patient discharged from practice 04/22/20

## 2020-07-03 NOTE — Telephone Encounter (Signed)
Patient is calling in regards to birth control, past due for depo.

## 2020-07-09 MED FILL — KETOCONAZOLE 2% CREAM: 2 | 30 days supply | Qty: 60 | Fill #0

## 2020-07-09 MED FILL — MINOCYCLINE 100 MG CAPSULE: 100 | 30 days supply | Qty: 60 | Fill #0

## 2020-07-24 ENCOUNTER — Other Ambulatory Visit: Payer: Self-pay

## 2020-07-24 ENCOUNTER — Other Ambulatory Visit (HOSPITAL_COMMUNITY)
Admission: RE | Admit: 2020-07-24 | Discharge: 2020-07-24 | Disposition: A | Discharge status: Home / Self Care | Payer: No Typology Code available for payment source | Source: Ambulatory Visit

## 2020-07-24 ENCOUNTER — Ambulatory Visit: Payer: No Typology Code available for payment source

## 2020-07-24 VITALS — BP 109/64 | HR 81 | Temp 98.2°F | Ht 65.0 in | Wt 172.6 lb

## 2020-07-24 DIAGNOSIS — Z3009 Encounter for other general counseling and advice on contraception: Secondary | ICD-10-CM

## 2020-07-24 DIAGNOSIS — Z124 Encounter for screening for malignant neoplasm of cervix: Secondary | ICD-10-CM | POA: Diagnosis not present

## 2020-07-24 LAB — POCT URINE PREGNANCY: Preg Test, Ur: NEGATIVE

## 2020-07-24 MED ORDER — MEDROXYPROGESTERONE ACETATE 150 MG/ML IM SUSP
150.0000 mg | INTRAMUSCULAR | Status: DC
  Administered 2020-07-24 – 2021-06-17 (×5): 150 mg via INTRAMUSCULAR

## 2020-07-24 NOTE — Progress Notes (Signed)
History:  [name] is a 29 y.o. G2P1011 who presents to clinic today for a pap smear and contraception. She had a well woman exam done in March of this year but no pap smear done. She reports her last Depo was in May and she stopped because she wanted to take a break from birth control. She reports her last intercourse was 2 days ago and she used protection.  She denies any abnormal vaginal bleeding or discharge.   The following portions of the patient's history were reviewed and updated as appropriate: allergies, current medications, family history, past medical history, social history, past surgical history and problem list.  Review of Systems:  Review of Systems  Constitutional: Negative.  Negative for chills and fever.  Respiratory: Negative.   Cardiovascular: Negative.  Negative for chest pain.  Genitourinary: Negative.   Neurological: Negative.  Negative for dizziness and headaches.      Objective:  Physical Exam BP 109/64 (BP Location: Left Arm, Patient Position: Sitting, Cuff Size: Normal)   Pulse 81   Temp 98.2 F (36.8 C) (Oral)   Ht 5\' 5"  (1.651 m)   Wt 172 lb 9.6 oz (78.3 kg)   LMP 07/05/2020 (Exact Date)   BMI 28.72 kg/m  Physical Exam Vitals and nursing note reviewed.  Constitutional:      General: She is not in acute distress.    Appearance: She is well-developed.  HENT:     Head: Normocephalic.  Eyes:     Pupils: Pupils are equal, round, and reactive to light.  Cardiovascular:     Rate and Rhythm: Normal rate and regular rhythm.  Pulmonary:     Effort: Pulmonary effort is normal. No respiratory distress.     Breath sounds: Normal breath sounds.  Abdominal:     Palpations: Abdomen is soft.     Tenderness: There is no abdominal tenderness.  Genitourinary:    Comments: Pelvic exam: Cervix pink, visually closed, without lesion, scant white creamy discharge, vaginal walls and external genitalia normal Bimanual exam: Cervix 0/long/high, firm,  anterior, neg CMT, uterus nontender, nonenlarged, adnexa without tenderness, enlargement, or mass  Musculoskeletal:        General: Normal range of motion.     Cervical back: Normal range of motion.  Skin:    General: Skin is warm and dry.  Neurological:     Mental Status: She is alert and oriented to person, place, and time.  Psychiatric:        Behavior: Behavior normal.        Thought Content: Thought content normal.        Judgment: Judgment normal.     Assessment & Plan:   1. Encounter for counseling regarding contraception   2. Pap smear for cervical cancer screening    -Will follow up with Pap smear results as needed -Discussed timing of depo injections and to use a back up method for 2 weeks. -Patient to return as needed for gyn care  09/04/2020, CNM 07/24/2020 8:14 AM

## 2020-07-24 NOTE — Progress Notes (Signed)
   Subjective:  Pt in for Depo Provera injection.    Objective: Need for contraception. No unusual complaints.    Assessment: Pt tolerated Depo injection. Depo given Left upper outer quadrant. Negative Pregnancy test.  Plan:  Next injection due December 15-29, 2021.    Clovis Pu, RN

## 2020-07-24 NOTE — Patient Instructions (Signed)
Medroxyprogesterone injection [Contraceptive] What is this medicine? MEDROXYPROGESTERONE (me DROX ee proe JES te rone) contraceptive injections prevent pregnancy. They provide effective birth control for 3 months. Depo-subQ Provera 104 is also used for treating pain related to endometriosis. This medicine may be used for other purposes; ask your health care provider or pharmacist if you have questions. COMMON BRAND NAME(S): Depo-Provera, Depo-subQ Provera 104 What should I tell my health care provider before I take this medicine? They need to know if you have any of these conditions:  frequently drink alcohol  asthma  blood vessel disease or a history of a blood clot in the lungs or legs  bone disease such as osteoporosis  breast cancer  diabetes  eating disorder (anorexia nervosa or bulimia)  high blood pressure  HIV infection or AIDS  kidney disease  liver disease  mental depression  migraine  seizures (convulsions)  stroke  tobacco smoker  vaginal bleeding  an unusual or allergic reaction to medroxyprogesterone, other hormones, medicines, foods, dyes, or preservatives  pregnant or trying to get pregnant  breast-feeding How should I use this medicine? Depo-Provera Contraceptive injection is given into a muscle. Depo-subQ Provera 104 injection is given under the skin. These injections are given by a health care professional. You must not be pregnant before getting an injection. The injection is usually given during the first 5 days after the start of a menstrual period or 6 weeks after delivery of a baby. Talk to your pediatrician regarding the use of this medicine in children. Special care may be needed. These injections have been used in female children who have started having menstrual periods. Overdosage: If you think you have taken too much of this medicine contact a poison control center or emergency room at once. NOTE: This medicine is only for you. Do not  share this medicine with others. What if I miss a dose? Try not to miss a dose. You must get an injection once every 3 months to maintain birth control. If you cannot keep an appointment, call and reschedule it. If you wait longer than 13 weeks between Depo-Provera contraceptive injections or longer than 14 weeks between Depo-subQ Provera 104 injections, you could get pregnant. Use another method for birth control if you miss your appointment. You may also need a pregnancy test before receiving another injection. What may interact with this medicine? Do not take this medicine with any of the following medications:  bosentan This medicine may also interact with the following medications:  aminoglutethimide  antibiotics or medicines for infections, especially rifampin, rifabutin, rifapentine, and griseofulvin  aprepitant  barbiturate medicines such as phenobarbital or primidone  bexarotene  carbamazepine  medicines for seizures like ethotoin, felbamate, oxcarbazepine, phenytoin, topiramate  modafinil  St. John's wort This list may not describe all possible interactions. Give your health care provider a list of all the medicines, herbs, non-prescription drugs, or dietary supplements you use. Also tell them if you smoke, drink alcohol, or use illegal drugs. Some items may interact with your medicine. What should I watch for while using this medicine? This drug does not protect you against HIV infection (AIDS) or other sexually transmitted diseases. Use of this product may cause you to lose calcium from your bones. Loss of calcium may cause weak bones (osteoporosis). Only use this product for more than 2 years if other forms of birth control are not right for you. The longer you use this product for birth control the more likely you will be at risk   for weak bones. Ask your health care professional how you can keep strong bones. You may have a change in bleeding pattern or irregular periods.  Many females stop having periods while taking this drug. If you have received your injections on time, your chance of being pregnant is very low. If you think you may be pregnant, see your health care professional as soon as possible. Tell your health care professional if you want to get pregnant within the next year. The effect of this medicine may last a long time after you get your last injection. What side effects may I notice from receiving this medicine? Side effects that you should report to your doctor or health care professional as soon as possible:  allergic reactions like skin rash, itching or hives, swelling of the face, lips, or tongue  breast tenderness or discharge  breathing problems  changes in vision  depression  feeling faint or lightheaded, falls  fever  pain in the abdomen, chest, groin, or leg  problems with balance, talking, walking  unusually weak or tired  yellowing of the eyes or skin Side effects that usually do not require medical attention (report to your doctor or health care professional if they continue or are bothersome):  acne  fluid retention and swelling  headache  irregular periods, spotting, or absent periods  temporary pain, itching, or skin reaction at site where injected  weight gain This list may not describe all possible side effects. Call your doctor for medical advice about side effects. You may report side effects to FDA at 1-800-FDA-1088. Where should I keep my medicine? This does not apply. The injection will be given to you by a health care professional. NOTE: This sheet is a summary. It may not cover all possible information. If you have questions about this medicine, talk to your doctor, pharmacist, or health care provider.  2020 Elsevier/Gold Standard (2008-11-02 18:37:56)  

## 2020-07-29 LAB — CYTOLOGY - PAP: Diagnosis: NEGATIVE

## 2020-07-31 MED FILL — valACYclovir HCL 1 GM TABS: 1 | 30 days supply | Qty: 30 | Fill #5

## 2020-08-01 ENCOUNTER — Ambulatory Visit: Payer: Self-pay | Admitting: *Deleted

## 2020-08-01 ENCOUNTER — Telehealth: Payer: Self-pay | Admitting: *Deleted

## 2020-08-01 NOTE — Telephone Encounter (Signed)
Pt calling to inquire which birth control injection she was given at [facility] last week. States she has appt with dermatologist tomorrow and MD wanted to know. Pt states she was not certain of med. Given info, Depo Provera.  Pt verbalizes understanding.

## 2020-08-02 ENCOUNTER — Other Ambulatory Visit (HOSPITAL_COMMUNITY): Payer: Self-pay | Admitting: Dermatology

## 2020-08-02 MED FILL — HYDROXYZINE PAM 25 MG CAP: 25 | 25 days supply | Qty: 50 | Fill #0

## 2020-08-02 MED FILL — CIMETIDINE 400 MG TABLET: 400 | 30 days supply | Qty: 90 | Fill #0

## 2020-08-15 ENCOUNTER — Encounter (HOSPITAL_COMMUNITY): Payer: Self-pay

## 2020-08-15 ENCOUNTER — Other Ambulatory Visit: Payer: Self-pay

## 2020-08-15 ENCOUNTER — Emergency Department (HOSPITAL_COMMUNITY)
Admission: EM | Admit: 2020-08-15 | Discharge: 2020-08-15 | Disposition: A | Discharge status: Home / Self Care | Payer: No Typology Code available for payment source | Attending: Emergency Medicine | Admitting: Emergency Medicine

## 2020-08-15 DIAGNOSIS — R21 Rash and other nonspecific skin eruption: Secondary | ICD-10-CM | POA: Insufficient documentation

## 2020-08-15 NOTE — Discharge Instructions (Signed)
Please continue to follow-up with your dermatologist.  You may use an antihistamine such as Benadryl or--if this makes you too drowsy--Zyrtec as needed for your symptoms.  Please drink plenty of water.  Please return to ER for new or concerning symptoms such as once we discussed.

## 2020-08-15 NOTE — ED Triage Notes (Addendum)
Pt arrives to ED w/ c/o intermittent rash x 2 weeks. No rash at this time.

## 2020-08-15 NOTE — ED Provider Notes (Signed)
[facility] EMERGENCY DEPARTMENT Provider Note   CSN: [id number] Arrival date & time: 08/15/20  1119     History Chief Complaint  Patient presents with  . Rash    Meagan Skinner is a 29 y.o. female.  HPI Patient past medical history detailed above presented today for 2 weeks of intermittent rashes that are itchy, intermittent, occur only at home.  She states no recent changes with detergents, no new animals in the house, no recent travel, no new people in the house, no new known allergies.  No history of asthma.  She states that they tend to occur on her arms and legs occasionally on her forehead.  She has been seen by dermatologist and has been prescribed several medications so far including minocycline. Has also received steroids (pred) and shot.  She denies any rash today.  She states that last eruption was yesterday evening.  She is coming to the ER today because she was concerned over "systemic problem ".  She denies any fevers, chills, nausea, vomiting, abdominal pain, diarrhea.  No history of anaphylaxis.  She denies any wheezing or shortness of breath any occurrences.      Past Medical History:  Diagnosis Date  . Depression    Postpartum depression  . Dysmenorrhea   . Headache   . Herpes    no outbreaks, +blood test  . Infection    uti  . 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    Patient Active Problem List   Diagnosis Date Noted  . Normal labor 10/09/2016  . Premature cervical dilation in third trimester 08/31/2016    Past Surgical History:  Procedure Laterality Date  . MULTIPLE TOOTH EXTRACTIONS       OB History    Gravida  2   Para  1   Term  1   Preterm      AB  1   Living  1     SAB  0   TAB  1   Ectopic      Multiple  0   Live Births  1           Family History  Problem Relation Age of Onset  . Diabetes Maternal Grandmother   . Crohn's disease Maternal  Grandmother   . Fibromyalgia Maternal Grandmother   . Heart disease Maternal Grandmother   . Asthma Maternal Grandmother   . Stroke Maternal Grandmother   . Hypertension Maternal Grandmother   . Cancer Maternal Grandfather   . Asthma Sister   . Heart disease Sister   . Asthma Brother     Social History   Tobacco Use  . Smoking status: Never Smoker  . Smokeless tobacco: Never Used  Vaping Use  . Vaping Use: Never used  Substance Use Topics  . Alcohol use: Yes    Alcohol/week: 2.0 standard drinks    Types: 2 Glasses of wine per week    Comment: socially  . Drug use: No    Home Medications Prior to Admission medications   Medication Sig Start Date End Date Taking? Authorizing Provider  fluconazole (DIFLUCAN) 150 MG tablet Take 1 tablet today and may repeat in 72 hours if needed. 12/29/19   Patton Salles, MD  metroNIDAZOLE (FLAGYL) 500 MG tablet Take 1 tablet (500 mg total) by mouth 2 (two) times daily. 12/29/19   Patton Salles, MD  valACYclovir (VALTREX) 1000 MG tablet Take  1,000 mg by mouth 2 (two) times daily.    [provider]    Allergies    Patient has no known allergies.  Review of Systems   Review of Systems  Constitutional: Negative for fever.  HENT: Negative for congestion.   Respiratory: Negative for shortness of breath.   Cardiovascular: Negative for chest pain.  Gastrointestinal: Negative for abdominal distention.  Skin: Positive for rash.  Neurological: Negative for dizziness and headaches.    Physical Exam Updated Vital Signs BP 127/82   Pulse 70   Temp 98 F (36.7 C) (Oral)   Resp 16   SpO2 100%   Physical Exam Vitals and nursing note reviewed.  Constitutional:      General: She is not in acute distress.    Appearance: Normal appearance. She is not ill-appearing.  HENT:     Head: Normocephalic and atraumatic.  Eyes:     General: No scleral icterus.       Right eye: No discharge.        Left eye: No  discharge.     Conjunctiva/sclera: Conjunctivae normal.  Pulmonary:     Effort: Pulmonary effort is normal.     Breath sounds: No stridor.  Skin:    Comments: Skin is without any rashes, lesions, bruising or erythema.  Warm and dry to touch.  Neurological:     Mental Status: She is alert and oriented to person, place, and time. Mental status is at baseline.     ED Results / Procedures / Treatments   Labs (all labs ordered are listed, but only abnormal results are displayed) Labs Reviewed - No data to display  EKG None  Radiology No results found.  Procedures Procedures (including critical care time)  Medications Ordered in ED Medications - No data to display  ED Course  I have reviewed the triage vital signs and the nursing notes.  Pertinent labs & imaging results that were available during my care of the patient were reviewed by me and considered in my medical decision making (see chart for details).    MDM Rules/Calculators/A&P                          Patient past medical history detailed above presented today for 2 weeks of intermittent rashes that are itchy, intermittent, occur only at home.  She states no recent changes with detergents, no new animals in the house, no recent travel, no new people in the house, no new known allergies.  No history of asthma.  She states that they tend to occur on her arms and legs occasionally on her forehead.  She has been seen by dermatologist and has been prescribed several medications so far including minocycline. Has also received steroids (pred) and shot.  She denies any rash today.  She states that last eruption was yesterday evening.  She is coming to the ER today because she was concerned over "systemic problem ".  She denies any fevers, chills, nausea, vomiting, abdominal pain, diarrhea.  No history of anaphylaxis.  She denies any wheezing or shortness of breath any occurrences.  Patient is well-appearing today.  Physical exam is  unremarkable.  Patient has dermatologist that she follows with and has scheduled appointment with allergist.  Will discharge with close follow-up of these.  Given return precautions.  Final Clinical Impression(s) / ED Diagnoses Final diagnoses:  Rash and nonspecific skin eruption    Rx / DC Orders ED Discharge Orders  None       Gailen Shelter, [place] 08/15/20 1240    Jacalyn Lefevre, MD 08/15/20 1248

## 2020-08-22 ENCOUNTER — Other Ambulatory Visit: Payer: Self-pay

## 2020-08-22 ENCOUNTER — Ambulatory Visit (INDEPENDENT_AMBULATORY_CARE_PROVIDER_SITE_OTHER): Payer: No Typology Code available for payment source | Admitting: Allergy

## 2020-08-22 ENCOUNTER — Other Ambulatory Visit: Payer: Self-pay | Admitting: Allergy

## 2020-08-22 ENCOUNTER — Encounter: Payer: Self-pay | Admitting: Allergy

## 2020-08-22 VITALS — BP 118/80 | HR 86 | Temp 98.4°F | Resp 14 | Ht 65.0 in | Wt 168.8 lb

## 2020-08-22 DIAGNOSIS — L508 Other urticaria: Secondary | ICD-10-CM | POA: Diagnosis not present

## 2020-08-22 DIAGNOSIS — J3089 Other allergic rhinitis: Secondary | ICD-10-CM

## 2020-08-22 DIAGNOSIS — T781XXD Other adverse food reactions, not elsewhere classified, subsequent encounter: Secondary | ICD-10-CM | POA: Diagnosis not present

## 2020-08-22 MED ORDER — FAMOTIDINE 20 MG PO TABS: 20.0000 mg | ORAL_TABLET | Freq: Two times a day (BID) | ORAL | 5 refills | Status: DC

## 2020-08-22 MED ORDER — CETIRIZINE HCL 10 MG PO TABS: 10.0000 mg | ORAL_TABLET | Freq: Two times a day (BID) | ORAL | 5 refills | Status: DC

## 2020-08-22 MED FILL — FAMOTIDINE 20 MG TABS: 20 | 30 days supply | Qty: 60 | Fill #0

## 2020-08-22 NOTE — Progress Notes (Signed)
New Patient Note  RE: Meagan Skinner MRN: [id number] DOB: 1991/06/28 Date of Office Visit: 08/22/2020  Referring provider: Lin Landsman, MD Primary care provider: Lin Landsman, MD  Chief Complaint: Chronic urticaria  History of present illness: Meagan Skinner is a 29 y.o. female presenting today for evaluation of chronic urticaria.  History and physical obtained by Dr. Dorcas Mcmurray medicine resident.   Patient states that she has had a 1 month history of pruritic papular rash distributed throughout various parts of her body.  The rash occurs daily with multiple papules that coalesce into patches.  Patient states that these lesions are very pruritic and usually go away within several minutes.  They pop up sporadically multiple different areas of her body.  She was recently seen by dermatology prior to the onset of this rash and was diagnosed with confluent and reticulated papillomatosis and started on minocycline.  Patient states that she was on this medication for roughly 2 weeks before self discontinuation.  Roughly 4 days later patient noticed these bumps popping up over her upper extremities face and chest.  Patient returned to the dermatologist and was discontinued on this medication and given topical and systemic steroid therapy with mild effect.  Patient denies any recent changes in her detergent, stating that they switched again 2 months ago without any issues.  Otherwise she has not had any other changes in her perfumes or hand soaps.  She has no new medications other than Depo-Provera which had been on previously and only recently restarted this medication in September.  Patient states that she was previously tested for allergies when she was younger and found to have allergies to dust, pollen and cats among others.  She denies any significant seasonal allergy symptoms but does occasionally get some rhinorrhea.  He denies any history of asthma or chronic sinus infections.  She has  never had an anaphylactic reaction but does admit to some oral pruritus after eating bananas, pineapples, and limes.  She states that her grandmother, sister, and brother have a history of asthma and her brother has a history of eczema.  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  Patient denies any other systemic symptoms such as fever, cough, rhinorrhea, shortness of breath, myalgias, or arthralgias.  She did provide pictures of her rash that do look consistent with urticaria and urticarial wheals.  Several of the pictures she states are from scratching where she then developed a raised scratch mark.  Review of systems: Review of Systems  Constitutional: Negative.   HENT: Negative.   Eyes: Negative.   Respiratory: Negative.   Cardiovascular: Negative.   Gastrointestinal: Negative.   Musculoskeletal: Negative.   Skin: Positive for itching and rash.  Neurological: Negative.     All other systems negative unless noted above in HPI  Past medical history: Past Medical History:  Diagnosis Date  . Depression    Postpartum depression  . Dysmenorrhea   . Headache   . Herpes    no outbreaks, +blood test  . Infection    uti  . 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    Past surgical history: Past Surgical History:  Procedure Laterality Date  . MULTIPLE TOOTH EXTRACTIONS      Family history:  Family History  Problem Relation Age of Onset  . Diabetes Maternal Grandmother   . Crohn's disease Maternal Grandmother   .  Fibromyalgia Maternal Grandmother   . Heart disease Maternal Grandmother   . Asthma Maternal Grandmother   . Stroke Maternal Grandmother   . Hypertension Maternal Grandmother   . Cancer Maternal Grandfather   . Asthma Sister   . Heart disease Sister   . Asthma Brother     Social history: She lives in a townhome with carpeting with gas heating and  central cooling.  Dog in the home.  There is no concern for water damage, mildew or roaches in the home.  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  She denies any smoking history.  Medication List: Current Outpatient Medications  Medication Sig Dispense Refill  . hydrOXYzine (VISTARIL) 25 MG capsule Take 25-50 mg by mouth at bedtime as needed.    . valACYclovir (VALTREX) 1000 MG tablet Take 1,000 mg by mouth 2 (two) times daily.    . cetirizine (ZYRTEC) 10 MG tablet Take 1 tablet (10 mg total) by mouth 2 (two) times daily. 60 tablet 5  . famotidine (PEPCID) 20 MG tablet Take 1 tablet (20 mg total) by mouth 2 (two) times daily. 60 tablet 5  . fluconazole (DIFLUCAN) 150 MG tablet Take 1 tablet today and may repeat in 72 hours if needed. (Patient not taking: Reported on 08/22/2020) 2 tablet 0  . metroNIDAZOLE (FLAGYL) 500 MG tablet Take 1 tablet (500 mg total) by mouth 2 (two) times daily. (Patient not taking: Reported on 08/22/2020) 14 tablet 0   Current Facility-Administered Medications  Medication Dose Route Frequency Provider Last Rate Last Admin  . medroxyPROGESTERone (DEPO-PROVERA) injection 150 mg  150 mg Intramuscular Q90 days Wende Mott, CNM   150 mg at 07/24/20 0848    Known medication allergies: No Known Allergies   Physical examination: Blood pressure 118/80, pulse 86, temperature 98.4 F (36.9 C), resp. rate 14, height 5' 5"  (1.651 m), weight 168 lb 12.8 oz (76.6 kg), SpO2 98 %.  General: Alert, interactive, in no acute distress. HEENT: TMs pearly gray, turbinates non-edematous without discharge, post-pharynx unremarkable. Neck: Supple without lymphadenopathy. Lungs: Clear to auscultation without wheezing, rhonchi or rales. {no increased work of breathing. CV: Normal S1, S2 without murmurs. Abdomen: Nondistended, nontender. Skin: Warm and dry, without lesions or rashes. Extremities:  No clubbing, cyanosis or edema. Neuro:   Grossly intact.  Diagnositics/Labs: Labs:  CBC with differential, CMP, tryptase, chronic urticaria panel, ANA with reflex, ESR, allergens with total IgE  Allergy testing: Lyme, pineapple, banana, alpha-gal panel.  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  Allergy testing results were read and interpreted by provider, documented by clinical staff.   Assessment and plan:   Urticaria - at this time etiology of hives is unknown however is most likely spontaneous in nature.  Hives can be caused by a variety of different triggers including illness/infection, foods, medications, stings, exercise, pressure, vibrations, extremes of temperature to name a few however majority of the time there is no identifiable trigger.  Your symptoms have been ongoing for 4 weeks thus will obtain labwork to evaluate: CBC w diff, CMP, tryptase, hive panel, environmental panel, alpha-gal panel, inflammatory markers -For management of hives recommend the following high-dose antihistamine regimen: Zyrtec 10 mg 1 tab twice a day with Pepcid 20 mg 1 tab twice a day -Discussed if the above regimen is still not enough to manage hives then would recommend adding in Singulair to this regimen. -If the 3 medication antihistamine regimen as above is not enough then we did  briefly discuss adding Xolair monthly injections to her treatment plan.  Xolair is very effective in controlling spontaneous or idiopathic chronic hives.  We will plan to discuss this in further detail if needed.  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 The oral allergy syndrome (OAS) or pollen-food allergy syndrome (PFAS) is a relatively common form of food allergy, particularly in adults. It typically occurs in people who have pollen allergies when the immune system "sees" proteins on the food that look like proteins on the  pollen. This results in the allergy antibody (IgE) binding to the food instead of the pollen. Patients typically report itching and/or mild swelling of the mouth and throat immediately following ingestion of certain uncooked fruits (including nuts) or raw vegetables. Only a very small number of affected individuals experience systemic allergic reactions, such as anaphylaxis which occurs with true food allergies.    Allergic rhinitis -As above we will obtain environmental panel to see what she is allergic to at this point in time -She will continue antihistamine as above  Follow-up in 2 to 3 months or sooner if needed  Marianna Payment, DO [facility] medicine resident  Attestation: I appreciate the opportunity to take part in [place] care. Please do not hesitate to contact me with questions.  I performed a history and physical examination of the patient and discussed management with the resident. I reviewed the resident's note and agree with the documented findings and plan of care. The note in its entirety was edited by myself, including the physical exam, assessment, and plan.    Prudy Feeler, MD Allergy/Immunology [facility]

## 2020-08-22 NOTE — Patient Instructions (Addendum)
Urticaria - at this time etiology of hives is unknown however is most likely spontaneous in nature.  Hives can be caused by a variety of different triggers including illness/infection, foods, medications, stings, exercise, pressure, vibrations, extremes of temperature to name a few however majority of the time there is no identifiable trigger.  Your symptoms have been ongoing for 4 weeks thus will obtain labwork to evaluate: CBC w diff, CMP, tryptase, hive panel, environmental panel, alpha-gal panel, inflammatory markers -For management of hives recommend the following high-dose antihistamine regimen: Zyrtec 10 mg 1 tab twice a day with Pepcid 20 mg 1 tab twice a day -Discussed if the above regimen is still not enough to manage hives then would recommend adding in Singulair to this regimen. -If the 3 medication antihistamine regimen as above is not enough then we did briefly discuss adding Xolair monthly injections to her treatment plan.  Xolair is very effective in controlling spontaneous or idiopathic chronic hives.  We will plan to discuss this in further detail if needed.  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 The oral allergy syndrome (OAS) or pollen-food allergy syndrome (PFAS) is a relatively common form of food allergy, particularly in adults. It typically occurs in people who have pollen allergies when the immune system "sees" proteins on the food that look like proteins on the pollen. This results in the allergy antibody (IgE) binding to the food instead of the pollen. Patients typically report itching and/or mild swelling of the mouth and throat immediately following ingestion of certain uncooked fruits (including nuts) or raw vegetables. Only a very small number of affected individuals experience systemic allergic reactions, such as anaphylaxis  which occurs with true food allergies.      Environmental allergy -As above we will obtain environmental panel to see what she is allergic to at this point in time -She will continue antihistamine as above  Follow-up in 2 to 3 months or sooner if needed

## 2020-08-23 LAB — ALPHA-GAL PANEL

## 2020-08-26 ENCOUNTER — Telehealth: Payer: Self-pay | Admitting: Allergy

## 2020-08-26 NOTE — Telephone Encounter (Signed)
Patient was seen 08/22/20. She said she has received test results on MyChart and would like to discuss them with a nurse.

## 2020-08-26 NOTE — Telephone Encounter (Signed)
Called Meagan Skinner to inform her that Dr. Delorse Lek has not yet seen her labs so I could not discuss them with her at this time. I informed Meagan Skinner that once Dr. Delorse Lek reviews them she will receive a call and we will be able to discuss them at that time. Meagan Skinner verbalized understanding.

## 2020-08-28 ENCOUNTER — Telehealth: Payer: Self-pay | Admitting: Allergy

## 2020-08-28 NOTE — Telephone Encounter (Signed)
Patient called again wanting her results on her labs. [phone]

## 2020-08-30 LAB — CBC WITH DIFFERENTIAL
Basophils Absolute: 0.1 10*3/uL (ref 0.0–0.2)
Basos: 1 %
EOS (ABSOLUTE): 0.1 10*3/uL (ref 0.0–0.4)
Eos: 1 %
Hematocrit: 41.5 % (ref 34.0–46.6)
Hemoglobin: 13.3 g/dL (ref 11.1–15.9)
Immature Grans (Abs): 0 10*3/uL (ref 0.0–0.1)
Immature Granulocytes: 0 %
Lymphocytes Absolute: 2 10*3/uL (ref 0.7–3.1)
Lymphs: 19 %
MCH: 28.5 pg (ref 26.6–33.0)
MCHC: 32 g/dL (ref 31.5–35.7)
MCV: 89 fL (ref 79–97)
Monocytes Absolute: 0.7 10*3/uL (ref 0.1–0.9)
Monocytes: 7 %
Neutrophils Absolute: 7.5 10*3/uL — ABNORMAL HIGH (ref 1.4–7.0)
Neutrophils: 72 %
RBC: 4.66 x10E6/uL (ref 3.77–5.28)
RDW: 13.4 % (ref 11.7–15.4)
WBC: 10.4 10*3/uL (ref 3.4–10.8)

## 2020-08-30 LAB — TRYPTASE: Tryptase: 5.3 ug/L (ref 2.2–13.2)

## 2020-08-30 LAB — ENA+DNA/DS+SJORGEN'S
ENA RNP Ab: 2.7 AI — ABNORMAL HIGH (ref 0.0–0.9)
ENA SM Ab Ser-aCnc: <0.2 AI (ref 0.0–0.9)
ENA SSA (RO) Ab: <0.2 AI (ref 0.0–0.9)
ENA SSB (LA) Ab: <0.2 AI (ref 0.0–0.9)
dsDNA Ab: 1 IU/mL (ref 0–9)

## 2020-08-30 LAB — ALPHA-GAL PANEL
Alpha Gal IgE*: <0.1 kU/L (ref <0.10)
Beef (Bos spp) IgE: <0.1 kU/L (ref <0.35)
Class Interpretation: 0
Class Interpretation: 0
Class Interpretation: 0
Lamb/Mutton (Ovis spp) IgE: <0.1 kU/L (ref <0.35)
Pork (Sus spp) IgE: <0.1 kU/L (ref <0.35)

## 2020-08-30 LAB — COMPREHENSIVE METABOLIC PANEL
ALT: 28 IU/L (ref 0–32)
AST: 20 IU/L (ref 0–40)
Albumin/Globulin Ratio: 1.7 (ref 1.2–2.2)
Albumin: 4.8 g/dL (ref 3.9–5.0)
Alkaline Phosphatase: 50 IU/L (ref 44–121)
BUN/Creatinine Ratio: 13 (ref 9–23)
BUN: 11 mg/dL (ref 6–20)
Bilirubin Total: 0.3 mg/dL (ref 0.0–1.2)
CO2: 24 mmol/L (ref 20–29)
Calcium: 9.7 mg/dL (ref 8.7–10.2)
Chloride: 103 mmol/L (ref 96–106)
Creatinine, Ser: 0.82 mg/dL (ref 0.57–1.00)
GFR calc Af Amer: 112 mL/min/{1.73_m2} (ref >59)
GFR calc non Af Amer: 97 mL/min/{1.73_m2} (ref >59)
Globulin, Total: 2.9 g/dL (ref 1.5–4.5)
Glucose: 91 mg/dL (ref 65–99)
Potassium: 4.6 mmol/L (ref 3.5–5.2)
Sodium: 140 mmol/L (ref 134–144)
Total Protein: 7.7 g/dL (ref 6.0–8.5)

## 2020-08-30 LAB — ALLERGENS W/TOTAL IGE AREA 2
Alternaria Alternata IgE: 7.93 kU/L — AB
Aspergillus Fumigatus IgE: 1.64 kU/L — AB
Bermuda Grass IgE: <0.1 kU/L
Cat Dander IgE: <0.1 kU/L
Cedar, Mountain IgE: 0.15 kU/L — AB
Cladosporium Herbarum IgE: 0.69 kU/L — AB
Cockroach, German IgE: <0.1 kU/L
Common Silver Birch IgE: 0.15 kU/L — AB
Cottonwood IgE: <0.1 kU/L
D Farinae IgE: 0.14 kU/L — AB
D Pteronyssinus IgE: 0.22 kU/L — AB
Dog Dander IgE: 0.87 kU/L — AB
Elm, American IgE: 0.44 kU/L — AB
IgE (Immunoglobulin E), Serum: 327 IU/mL (ref 6–495)
Johnson Grass IgE: <0.1 kU/L
Maple/Box Elder IgE: <0.1 kU/L
Mouse Urine IgE: <0.1 kU/L
Oak, White IgE: 0.15 kU/L — AB
Pecan, Hickory IgE: 0.11 kU/L — AB
Penicillium Chrysogen IgE: 0.15 kU/L — AB
Pigweed, Rough IgE: <0.1 kU/L
Ragweed, Short IgE: 0.33 kU/L — AB
Sheep Sorrel IgE Qn: <0.1 kU/L
Timothy Grass IgE: <0.1 kU/L
White Mulberry IgE: <0.1 kU/L

## 2020-08-30 LAB — ALLERGEN, PINEAPPLE, F210: Pineapple IgE: 0.25 kU/L — AB

## 2020-08-30 LAB — SEDIMENTATION RATE: Sed Rate: 11 mm/hr (ref 0–32)

## 2020-08-30 LAB — CHRONIC URTICARIA: cu index: 5.1 (ref <10)

## 2020-08-30 LAB — F306-IGE LIME: Allergen Lime IgE: <0.1 kU/L

## 2020-08-30 LAB — ALLERGEN BANANA: Allergen Banana IgE: 0.19 kU/L — AB

## 2020-08-30 LAB — ANA W/REFLEX: Anti Nuclear Antibody (ANA): POSITIVE — AB

## 2020-09-03 ENCOUNTER — Telehealth: Payer: Self-pay

## 2020-09-03 MED FILL — FAMOTIDINE 20 MG TABS: 20 | 30 days supply | Qty: 60 | Fill #0

## 2020-09-03 NOTE — Telephone Encounter (Signed)
Dr Delorse Lek would like a referral to  rheumatology  for chronic hives with positive ANA and positive RNP antibody please and thank you

## 2020-09-03 NOTE — Telephone Encounter (Signed)
Correct.  She is okay to eat red meats.  I discussed this in the result note which has been discussed with the patient already.

## 2020-09-04 ENCOUNTER — Ambulatory Visit (INDEPENDENT_AMBULATORY_CARE_PROVIDER_SITE_OTHER): Payer: No Typology Code available for payment source | Admitting: *Deleted

## 2020-09-04 ENCOUNTER — Other Ambulatory Visit (HOSPITAL_COMMUNITY)
Admission: RE | Admit: 2020-09-04 | Discharge: 2020-09-04 | Disposition: A | Discharge status: Home / Self Care | Payer: No Typology Code available for payment source | Source: Ambulatory Visit | Attending: Obstetrics and Gynecology | Admitting: Obstetrics and Gynecology

## 2020-09-04 ENCOUNTER — Other Ambulatory Visit: Payer: Self-pay

## 2020-09-04 VITALS — BP 109/72 | HR 68 | Temp 97.4°F | Ht 65.0 in | Wt 168.2 lb

## 2020-09-04 DIAGNOSIS — Z113 Encounter for screening for infections with a predominantly sexual mode of transmission: Secondary | ICD-10-CM

## 2020-09-04 DIAGNOSIS — N898 Other specified noninflammatory disorders of vagina: Secondary | ICD-10-CM

## 2020-09-04 NOTE — Progress Notes (Signed)
   SUBJECTIVE:  29 y.o. female complains of white, malodorous and milky vaginal discharge for 1 week(s). Denies abnormal vaginal bleeding or significant pelvic pain or fever. No UTI symptoms. Denies history of known exposure to STD.  No LMP recorded. Patient has had an injection.  OBJECTIVE:  She appears well, afebrile. Urine dipstick: not done.  ASSESSMENT:  Vaginal Discharge  Vaginal Odor   PLAN:  GC, chlamydia, trichomonas, BVAG, CVAG probe sent to lab. Treatment: To be determined once lab results are received ROV prn if symptoms persist or worsen.  Clovis Pu, RN

## 2020-09-05 ENCOUNTER — Other Ambulatory Visit: Payer: Self-pay | Admitting: *Deleted

## 2020-09-05 DIAGNOSIS — N760 Acute vaginitis: Secondary | ICD-10-CM

## 2020-09-05 LAB — CERVICOVAGINAL ANCILLARY ONLY
Bacterial Vaginitis (gardnerella): POSITIVE — AB
Candida Glabrata: NEGATIVE
Candida Vaginitis: POSITIVE — AB
Chlamydia: NEGATIVE
Comment: NEGATIVE
Comment: NEGATIVE
Comment: NEGATIVE
Comment: NEGATIVE
Comment: NEGATIVE
Comment: NORMAL
Neisseria Gonorrhea: NEGATIVE
Trichomonas: NEGATIVE

## 2020-09-05 MED ORDER — METRONIDAZOLE 500 MG PO TABS: 500.0000 mg | ORAL_TABLET | Freq: Two times a day (BID) | ORAL | 0 refills | Status: DC

## 2020-09-05 MED ORDER — FLUCONAZOLE 150 MG PO TABS: ORAL_TABLET | ORAL | 0 refills | Status: DC

## 2020-09-05 MED FILL — FLUCONAZOLE 150 MG TABS: 150 | 1 days supply | Qty: 1 | Fill #0

## 2020-09-05 MED FILL — metroNIDAZOLE 500 MG TABS: 500 | 7 days supply | Qty: 14 | Fill #0

## 2020-09-25 NOTE — Progress Notes (Signed)
Office Visit Note  Patient: Meagan Skinner             Date of Birth: 23-Jul-1991           MRN: [id number]             PCP: Lin Landsman, MD Referring: Juanita Craver* Visit Date: 09/26/2020 Occupation: Mail clerk at [employer]  Subjective:   History of Present Illness: Robbin Loughmiller is a 29 y.o. female here for evaluation of positive ANA with hives. Symptoms started around the beginning of October with itchy intermittent rashes. These started on her face and upper chest and arms and extended throughout upper extremities. These were raised wheals with itching without much discoloration. On her hands she developed swelling with a mixture of pain and itching with erythema. Her symptoms did improve with oral antihistamines and now she is feeling well. At this time her only residual symptoms are areas of hypopigmentation on the upper body. She denies symptoms of hair loss, lymphadenopathy, oral ulcers, pleurisy, fever, joint pain or swelling, or raynaud's phenomenon.   Labs reviewed ANA positive RNP 2.7 dsDNA neg SM neg SSA neg SSB neg CBC normal CMP normal ESR normal  12/2019 TSH normal  Activities of Daily Living:  Patient reports morning stiffness for 0 minutes.   Patient Reports nocturnal pain.  Difficulty dressing/grooming: Denies Difficulty climbing stairs: Denies Difficulty getting out of chair: Denies Difficulty using hands for taps, buttons, cutlery, and/or writing: Denies  Review of Systems  Constitutional: Negative for fatigue.  HENT: Negative for mouth sores, mouth dryness and nose dryness.   Eyes: Negative for pain, itching and dryness.  Respiratory: Negative for shortness of breath and difficulty breathing.   Cardiovascular: Negative for chest pain and palpitations.  Gastrointestinal: Positive for heartburn. Negative for blood in stool, constipation and diarrhea.  Endocrine: Negative for increased urination.  Genitourinary: Negative for  difficulty urinating.  Musculoskeletal: Positive for arthralgias, joint pain and joint swelling. Negative for myalgias, morning stiffness, muscle tenderness and myalgias.  Skin: Negative for color change, rash and redness.  Allergic/Immunologic: Negative for susceptible to infections.  Neurological: Negative for dizziness, numbness, headaches, memory loss and weakness.  Hematological: Negative for bruising/bleeding tendency.  Psychiatric/Behavioral: Negative for depressed mood and confusion. The patient is not nervous/anxious.     PMFS History:  Patient Active Problem List   Diagnosis Date Noted  . Urticaria 09/26/2020  . Positive ANA (antinuclear antibody) 09/26/2020  . Normal labor 10/09/2016  . Premature cervical dilation in third trimester 08/31/2016    Past Medical History:  Diagnosis Date  . Depression    Postpartum depression  . Dysmenorrhea   . Headache   . Herpes    no outbreaks, +blood test  . Infection    uti  . 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    Family History  Problem Relation Age of Onset  . Diabetes Maternal Grandmother   . Crohn's disease Maternal Grandmother   . Fibromyalgia Maternal Grandmother   . Heart disease Maternal Grandmother   . Asthma Maternal Grandmother   . Stroke Maternal Grandmother   . Hypertension Maternal Grandmother   . Rheum arthritis Maternal Grandmother   . Cancer Maternal Grandfather   . Asthma Sister   . Heart disease Sister   . Asthma Brother   . Healthy Son    Past Surgical History:  Procedure Laterality Date  . MULTIPLE TOOTH EXTRACTIONS     Social History  Social History Narrative  . Not on file   Immunization History  Administered Date(s) Administered  . PFIZER SARS-COV-2 Vaccination 05/31/2020, 06/21/2020     Objective: Vital Signs: BP 118/80 (BP Location: Left Arm, Patient Position: Sitting, Cuff Size: Normal)   Pulse 73   Resp 13   Ht $R'5\' 5"'wx$  (1.651 m)    Wt 169 lb 12.8 oz (77 kg)   BMI 28.26 kg/m    Physical Exam HENT:     Right Ear: External ear normal.     Left Ear: External ear normal.     Mouth/Throat:     Mouth: Mucous membranes are moist.     Pharynx: Oropharynx is clear.  Eyes:     Conjunctiva/sclera: Conjunctivae normal.  Cardiovascular:     Rate and Rhythm: Normal rate and regular rhythm.  Pulmonary:     Effort: Pulmonary effort is normal.     Breath sounds: Normal breath sounds.  Skin:    General: Skin is warm and dry.     Comments: Scattered hypopigmentation patches on upper arms and upper chest No erythema or wheals Nailfold capillaries appear normal  Neurological:     General: No focal deficit present.     Mental Status: She is alert.  Psychiatric:        Mood and Affect: Mood normal.     Musculoskeletal Exam:  Neck full range of motion no tenderness Shoulder, elbow, wrist, fingers full range of motion no tenderness or swelling Normal hip internal and external rotation without pain, no tenderness to lateral hip palpation Knees, ankles, MTPs full range of motion no tenderness or swelling  Investigation: No additional findings.  Imaging: No results found.  Recent Labs: Lab Results  Component Value Date   WBC 10.4 08/22/2020   HGB 13.3 08/22/2020   PLT 307 10/12/2018   NA 140 08/22/2020   K 4.6 08/22/2020   CL 103 08/22/2020   CO2 24 08/22/2020   GLUCOSE 91 08/22/2020   BUN 11 08/22/2020   CREATININE 0.82 08/22/2020   BILITOT 0.3 08/22/2020   ALKPHOS 50 08/22/2020   AST 20 08/22/2020   ALT 28 08/22/2020   PROT 7.7 08/22/2020   ALBUMIN 4.8 08/22/2020   CALCIUM 9.7 08/22/2020   GFRAA 112 08/22/2020    Speciality Comments: No specialty comments available.  Procedures:  No procedures performed Allergies: Patient has no known allergies.   Assessment / Plan:     Visit Diagnoses: Positive ANA (antinuclear antibody) Urticaria - Plan: C3 and C4, ANA, Complement component c1q, Protein /  creatinine ratio, urine, Urinalysis  Positive ANA and low positive isolated RNP Ab but clinically no disease activity at this time. Does not have mixed connective tissue disease at this time. The hand lesions could be consistent with urticarial vasculitis but less likely. Initial allergy workup was again autoimmune urticaria but will also check complement levels and screen for proteinuria. No additional specific workup or treatment need if negative findings, but could reassess if symptoms return and are present.  Orders: Orders Placed This Encounter  Procedures  . C3 and C4  . ANA  . Complement component c1q  . Protein / creatinine ratio, urine  . Urinalysis   No orders of the defined types were placed in this encounter.   Follow-Up Instructions: Return if symptoms worsen or fail to improve.   Collier Salina, MD  Note - This record has been created using [employer].  Chart creation errors have been sought, but may not always  have been located. Such creation errors do not reflect on  the standard of medical care.

## 2020-09-26 ENCOUNTER — Ambulatory Visit (INDEPENDENT_AMBULATORY_CARE_PROVIDER_SITE_OTHER): Payer: No Typology Code available for payment source | Admitting: Internal Medicine

## 2020-09-26 ENCOUNTER — Encounter: Payer: Self-pay | Admitting: Internal Medicine

## 2020-09-26 ENCOUNTER — Other Ambulatory Visit: Payer: Self-pay

## 2020-09-26 VITALS — BP 118/80 | HR 73 | Resp 13 | Ht 65.0 in | Wt 169.8 lb

## 2020-09-26 DIAGNOSIS — L509 Urticaria, unspecified: Secondary | ICD-10-CM | POA: Diagnosis not present

## 2020-09-26 DIAGNOSIS — R768 Other specified abnormal immunological findings in serum: Secondary | ICD-10-CM | POA: Insufficient documentation

## 2020-09-26 NOTE — Patient Instructions (Signed)
Antinuclear Antibody Test Why am I having this test? This is a test that is used to help diagnose systemic lupus erythematosus (SLE) and other autoimmune diseases. An autoimmune disease is a disease in which the body's own defense (immune)system attacks its organs. What is being tested? This test checks for antinuclear antibodies (ANA) in the blood. The presence of ANA is associated with several autoimmune diseases. It is seen in almost all patients with lupus. What kind of sample is taken? A blood sample is required for this test. It is usually collected by inserting a needle into a blood vessel. How are the results reported? Your test results will be reported as either positive or negative. A false-positive result can occur. A false positive is incorrect because it means that a condition is present when it is not. What do the results mean? A positive test result may mean that you have: Lupus. Other autoimmune diseases, such as rheumatoid arthritis, scleroderma, or Sjgren syndrome. Conditions that may cause a false-positive result include: Liver dysfunction. Myasthenia gravis. Infectious mononucleosis. Talk with your health care provider about what your results mean. Questions to ask your health care provider Ask your health care provider, or the department that is doing the test: When will my results be ready? How will I get my results? What are my treatment options? What other tests do I need? What are my next steps? Summary This is a test that is used to help diagnose systemic lupus erythematosus (SLE) and other autoimmune diseases. An autoimmune disease is a disease in which the body's own defense (immune)system attacks the body. This test checks for antinuclear antibodies (ANA) in the blood. The presence of ANA is associated with several autoimmune diseases. It is seen in almost all patients with lupus. Your test results will be reported as either positive or negative. Talk with  your health care provider about what your results mean.    

## 2020-09-30 NOTE — Progress Notes (Signed)
Lab tests including complement levels or protein in urine are negative. Interestingly her ANA test, that was positive a month ago, is now negative. This could indicate that the previous process was a reaction or limited event. I do not think any more workup is needed currently with no evidence of disease and no longer positive antibody test.

## 2020-10-02 LAB — PROTEIN / CREATININE RATIO, URINE
Creatinine, Urine: 172 mg/dL (ref 20–275)
Protein/Creat Ratio: 76 mg/g creat (ref 21–161)
Protein/Creatinine Ratio: 0.076 mg/mg creat (ref 0.021–0.16)
Total Protein, Urine: 13 mg/dL (ref 5–24)

## 2020-10-02 LAB — URINALYSIS
Bilirubin Urine: NEGATIVE
Glucose, UA: NEGATIVE
Hgb urine dipstick: NEGATIVE
Ketones, ur: NEGATIVE
Leukocytes,Ua: NEGATIVE
Nitrite: NEGATIVE
Protein, ur: NEGATIVE
Specific Gravity, Urine: 1.022 (ref 1.001–1.03)
pH: 6 (ref 5.0–8.0)

## 2020-10-02 LAB — C3 AND C4
C3 Complement: 142 mg/dL (ref 83–193)
C4 Complement: 38 mg/dL (ref 15–57)

## 2020-10-02 LAB — COMPLEMENT COMPONENT C1Q: Complement C1Q: 6.1 mg/dL (ref 5.0–8.6)

## 2020-10-02 LAB — ANA: Anti Nuclear Antibody (ANA): NEGATIVE

## 2020-10-09 ENCOUNTER — Encounter: Payer: No Typology Code available for payment source | Admitting: Family Medicine

## 2020-10-11 ENCOUNTER — Ambulatory Visit (INDEPENDENT_AMBULATORY_CARE_PROVIDER_SITE_OTHER): Payer: No Typology Code available for payment source | Admitting: *Deleted

## 2020-10-11 ENCOUNTER — Other Ambulatory Visit: Payer: Self-pay

## 2020-10-11 DIAGNOSIS — Z3009 Encounter for other general counseling and advice on contraception: Secondary | ICD-10-CM | POA: Diagnosis not present

## 2020-10-11 DIAGNOSIS — Z3042 Encounter for surveillance of injectable contraceptive: Secondary | ICD-10-CM

## 2020-10-11 NOTE — Progress Notes (Signed)
   Subjective:  Pt in for Depo Provera injection.    Objective: Need for contraception. No unusual complaints.    Assessment: Pt tolerated Depo injection. Depo given Right upper outer quadrant.   Plan:  Next injection due March 4-18, 2022.    Clovis Pu, RN

## 2020-10-14 ENCOUNTER — Ambulatory Visit: Payer: No Typology Code available for payment source

## 2020-10-24 ENCOUNTER — Other Ambulatory Visit: Payer: Self-pay | Admitting: Allergy

## 2020-10-24 ENCOUNTER — Other Ambulatory Visit: Payer: Self-pay

## 2020-10-24 ENCOUNTER — Ambulatory Visit (INDEPENDENT_AMBULATORY_CARE_PROVIDER_SITE_OTHER): Payer: No Typology Code available for payment source | Admitting: Allergy

## 2020-10-24 ENCOUNTER — Encounter: Payer: Self-pay | Admitting: Allergy

## 2020-10-24 VITALS — BP 104/70 | HR 74 | Temp 97.6°F | Resp 16 | Ht 65.0 in | Wt 166.2 lb

## 2020-10-24 DIAGNOSIS — T783XXD Angioneurotic edema, subsequent encounter: Secondary | ICD-10-CM | POA: Diagnosis not present

## 2020-10-24 DIAGNOSIS — J3089 Other allergic rhinitis: Secondary | ICD-10-CM

## 2020-10-24 DIAGNOSIS — L508 Other urticaria: Secondary | ICD-10-CM

## 2020-10-24 DIAGNOSIS — T781XXD Other adverse food reactions, not elsewhere classified, subsequent encounter: Secondary | ICD-10-CM | POA: Diagnosis not present

## 2020-10-24 MED ORDER — EPINEPHRINE 0.3 MG/0.3ML IJ SOAJ: 0.3000 mg | Freq: Once | INTRAMUSCULAR | 1 refills | Status: DC

## 2020-10-24 MED FILL — EPINEPHRINE 0.3 MG AUTO-INJ: 0.3 | 2 days supply | Qty: 2 | Fill #0

## 2020-10-24 NOTE — Patient Instructions (Addendum)
Angioedema - currently with lip swelling after having drink containing pineapple ingredient - pineapple and banana were positive on IgE food testing and thus recommend avoiding this foods in diet - prednisone 20mg  given in office.  If swelling is not resolved tomorrow then take additional 20mg  prednisone - take Zyrtec 1 tab twice a day for next several days - have access to self-injectable epinephrine (Epipen or AuviQ) 0.3mg  at all times - follow emergency action plan in case of allergic reaction - it is also possible that the swelling is related to pollen food allergy syndrome (see below)  Urticaria - at this time etiology of hives is unknown however is most likely spontaneous in nature.  Hives can be caused by a variety of different triggers including illness/infection, foods, medications, stings, exercise, pressure, vibrations, extremes of temperature to name a few however majority of the time there is no identifiable trigger.   -If hives become frequent then resume high-dose antihistamine regimen: Zyrtec 10 mg 1 tab twice a day with Pepcid 20 mg 1 tab twice a day -Discussed if the above regimen is still not enough to manage hives then would recommend adding in Singulair to this regimen. -If the 3 medication antihistamine regimen as above is not enough then we did briefly discuss adding Xolair monthly injections to her treatment plan.  Xolair is very effective in controlling spontaneous or idiopathic chronic hives.  We will plan to discuss this in further detail if needed.  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 It typically occurs in people who have pollen allergies when the immune system "sees" proteins on the food that look like proteins on the pollen. This results in the allergy  antibody (IgE) binding to the food instead of the pollen. Patients typically report itching and/or mild swelling of the mouth and throat immediately following ingestion of certain uncooked fruits (including nuts) or raw vegetables. Only a very small number of affected individuals experience systemic allergic reactions, such as anaphylaxis which occurs with true food allergies.      Environmental allergy -environmental allergy panel shows high IgE to molds; moderate IgE to dog dander; low IgE to dust mites, tree pollen, weed pollen.   -continue antihistamine as above as needed for allergy symptom control  Follow-up in 3 months or sooner if needed

## 2020-10-24 NOTE — Progress Notes (Signed)
Follow-up Note  RE: Meagan Skinner MRN: [id number] DOB: 19-Mar-1991 Date of Office Visit: 10/24/2020   History of present illness: Meagan Skinner is a 29 y.o. female presenting today for lip swelling.  She was last seen in the office on 08/22/2020 by myself.  She had a [employer] juice drink yesterday evening that contains pineapple around 7pm about an hour after having the drink she started developing upper lip swelling.  She took benadryl last night.  She woke up today and her lip was still swollen.  She has not taken any medication today.  She states she has not had any significant hives since the last visit and thus has not required use of the high-dose antihistamine regimen. She denies any respiratory, GI, CV related symptoms with the lip swelling. She did see the rheumatologist after the last visit due to positive rheumatoid markers.  However on repeat testing with the rheumatologist these abnormalities normalized.  Review of systems: Review of Systems  Constitutional: Negative.   HENT: Negative.   Eyes: Negative.   Respiratory: Negative.   Cardiovascular: Negative.   Gastrointestinal: Negative.   Musculoskeletal: Negative.   Skin:       See HPI  Neurological: Negative.     All other systems negative unless noted above in HPI  Past medical/social/surgical/family history have been reviewed and are unchanged unless specifically indicated below.  No changes  Medication List: Current Outpatient Medications  Medication Sig Dispense Refill  . cetirizine (ZYRTEC) 10 MG tablet Take 1 tablet (10 mg total) by mouth 2 (two) times daily. 60 tablet 5  . famotidine (PEPCID) 20 MG tablet Take 1 tablet (20 mg total) by mouth 2 (two) times daily. (Patient taking differently: Take 20 mg by mouth as needed.) 60 tablet 5  . hydrOXYzine (VISTARIL) 25 MG capsule Take 25-50 mg by mouth at bedtime as needed.    . valACYclovir (VALTREX) 1000 MG tablet Take 1,000 mg by mouth 2 (two)  times daily.    . fluconazole (DIFLUCAN) 150 MG tablet Take 1 tablet today. (Patient not taking: Reported on 10/24/2020) 1 tablet 0  . metroNIDAZOLE (FLAGYL) 500 MG tablet Take 1 tablet (500 mg total) by mouth 2 (two) times daily. Take with food. (Patient not taking: No sig reported) 14 tablet 0   Current Facility-Administered Medications  Medication Dose Route Frequency Provider Last Rate Last Admin  . medroxyPROGESTERone (DEPO-PROVERA) injection 150 mg  150 mg Intramuscular Q90 days Rolm Bookbinder, CNM   150 mg at 10/11/20 7062     Known medication allergies: No Known Allergies   Physical examination: Blood pressure 104/70, pulse 74, temperature 97.6 F (36.4 C), resp. rate 16, height 5\' 5"  (1.651 m), weight 166 lb 3.2 oz (75.4 kg), SpO2 100 %.  General: Alert, interactive, in no acute distress. HEENT: PERRLA, TMs pearly gray, turbinates non-edematous without discharge, post-pharynx non erythematous, upper lip is edematous Neck: Supple without lymphadenopathy. Lungs: Clear to auscultation without wheezing, rhonchi or rales. {no increased work of breathing. CV: Normal S1, S2 without murmurs. Abdomen: Nondistended, nontender. Skin: Warm and dry, without lesions or rashes. Extremities:  No clubbing, cyanosis or edema. Neuro:   Grossly intact.  Diagnositics/Labs: Labs:  Component     Latest Ref Rng & Units 08/22/2020  IgE (Immunoglobulin E), Serum     6 - 495 IU/mL 327  D Pteronyssinus IgE     Class 0/I kU/L 0.22 (A)  D Farinae IgE     Class 0/I kU/L 0.14 (A)  Cat Dander IgE     Class 0 kU/L <0.10  Dog Dander IgE     Class II kU/L 0.87 (A)  [place] Grass IgE     Class 0 kU/L <0.10  Timothy Grass IgE     Class 0 kU/L <0.10  [place] Grass IgE     Class 0 kU/L <0.10  Cockroach, German IgE     Class 0 kU/L <0.10  Penicillium Chrysogen IgE     Class 0/I kU/L 0.15 (A)  Cladosporium Herbarum IgE     Class II kU/L 0.69 (A)  Aspergillus Fumigatus IgE     Class III kU/L  1.64 (A)  Alternaria Alternata IgE     Class IV kU/L 7.93 (A)  Maple/Box Elder IgE     Class 0 kU/L <0.10  Common Silver Charletta Cousin IgE     Class 0/I kU/L 0.15 (A)  Cedar, Mountain IgE     Class 0/I kU/L 0.15 (A)  Oak, White IgE     Class 0/I kU/L 0.15 (A)  Elm, American IgE     Class I kU/L 0.44 (A)  Cottonwood IgE     Class 0 kU/L <0.10  Pecan, Hickory IgE     Class 0/I kU/L 0.11 (A)  White Mulberry IgE     Class 0 kU/L <0.10  Ragweed, Short IgE     Class I kU/L 0.33 (A)  Pigweed, Rough IgE     Class 0 kU/L <0.10  Sheep Sorrel IgE Qn     Class 0 kU/L <0.10  Mouse Urine IgE     Class 0 kU/L <0.10  WBC     3.4 - 10.8 x10E3/uL 10.4  RBC     3.77 - 5.28 x10E6/uL 4.66  Hemoglobin     11.1 - 15.9 g/dL 59.5  HCT     63.8 - 75.6 % 41.5  MCV     79 - 97 fL 89  MCH     26.6 - 33.0 pg 28.5  MCHC     31.5 - 35.7 g/dL 43.3  RDW     29.5 - 18.8 % 13.4  Neutrophils     Not Estab. % 72  Lymphs     Not Estab. % 19  Monocytes     Not Estab. % 7  Eos     Not Estab. % 1  Basos     Not Estab. % 1  NEUT#     1.4 - 7.0 x10E3/uL 7.5 (H)  Lymphocyte #     0.7 - 3.1 x10E3/uL 2.0  Monocytes Absolute     0.1 - 0.9 x10E3/uL 0.7  EOS (ABSOLUTE)     0.0 - 0.4 x10E3/uL 0.1  Basophils Absolute     0.0 - 0.2 x10E3/uL 0.1  Immature Granulocytes     Not Estab. % 0  Immature Grans (Abs)     0.0 - 0.1 x10E3/uL 0.0  Glucose     65 - 99 mg/dL 91  BUN     6 - 20 mg/dL 11  Creatinine     4.16 - 1.00 mg/dL 6.06  GFR, Est Non African American     >59 mL/min/1.73 97  GFR, Est African American     >59 mL/min/1.73 112  BUN/Creatinine Ratio     9 - 23 13  Sodium     134 - 144 mmol/L 140  Potassium     3.5 - 5.2 mmol/L 4.6  Chloride     96 - 106 mmol/L 103  CO2     20 - 29 mmol/L 24  Calcium     8.7 - 10.2 mg/dL 9.7  Total Protein     6.0 - 8.5 g/dL 7.7  Albumin     3.9 - 5.0 g/dL 4.8  Globulin, Total     1.5 - 4.5 g/dL 2.9  Albumin/Globulin Ratio     1.2 - 2.2 1.7  Total  Bilirubin     0.0 - 1.2 mg/dL 0.3  Alkaline Phosphatase     44 - 121 IU/L 50  AST     0 - 40 IU/L 20  ALT     0 - 32 IU/L 28  Beef (Bos spp) IgE     <0.35 kU/L <0.10  Class Interpretation      0  Lamb/Mutton (Ovis spp) IgE     <0.35 kU/L <0.10  Class Interpretation      0  Pork (Sus spp) IgE     <0.35 kU/L <0.10  Class Interpretation      0  Alpha Gal IgE*     <0.10 kU/L <0.10  dsDNA Ab     0 - 9 IU/mL 1  ENA RNP Ab     0.0 - 0.9 AI 2.7 (H)  ENA SM Ab Ser-aCnc     0.0 - 0.9 AI <0.2  ENA SSA (RO) Ab     0.0 - 0.9 AI <0.2  ENA SSB (LA) Ab     0.0 - 0.9 AI <0.2  SEE BELOW      Comment  Tryptase     2.2 - 13.2 ug/L 5.3  cu index     <10 5.1  Anti Nuclear Antibody (ANA)     Negative Positive (A)  Sed Rate     0 - 32 mm/hr 11  Allergen Banana IgE     Class 0/I kU/L 0.19 (A)  Pineapple IgE     Class 0/I kU/L 0.25 (A)  Allergen Lime IgE     Class 0 kU/L <0.10   Component     Latest Ref Rng & Units 09/26/2020  Creatinine, Urine     20 - 275 mg/dL 161  Protein/Creat Ratio     21 - 161 mg/g creat 76  Protein/Creatinine Ratio     0.021 - 0.16 mg/mg creat 0.076  Total Protein, Urine     5 - 24 mg/dL 13  C3 Complement     83 - 193 mg/dL 096  C4 Complement     15 - 57 mg/dL 38  Anti Nuclear Antibody (ANA)     NEGATIVE NEGATIVE  Complement C1Q     5.0 - 8.6 mg/dL 6.1    Assessment and plan:   Angioedema - currently with lip swelling after having drink containing pineapple ingredient - pineapple and banana were positive on IgE food testing and thus recommend avoiding this foods in diet - prednisone 20mg  given in office.  If swelling is not resolved tomorrow then take additional 20mg  prednisone - take Zyrtec 1 tab twice a day for next several days - have access to self-injectable epinephrine (Epipen or AuviQ) 0.3mg  at all times - follow emergency action plan in case of allergic reaction - it is also possible that the swelling is related to pollen food  allergy syndrome (see below)  Urticaria - at this time etiology of hives is unknown however is most likely spontaneous in nature.  Hives can be caused by a variety of different triggers including illness/infection, foods, medications, stings,  exercise, pressure, vibrations, extremes of temperature to name a few however majority of the time there is no identifiable trigger.   -If hives become frequent then resume high-dose antihistamine regimen: Zyrtec 10 mg 1 tab twice a day with Pepcid 20 mg 1 tab twice a day -Discussed if the above regimen is still not enough to manage hives then would recommend adding in Singulair to this regimen. -If the 3 medication antihistamine regimen as above is not enough then we did briefly discuss adding Xolair monthly injections to her treatment plan.  Xolair is very effective in controlling spontaneous or idiopathic chronic hives.  We will plan to discuss this in further detail if needed.  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 It typically occurs in people who have pollen allergies when the immune system "sees" proteins on the food that look like proteins on the pollen. This results in the allergy antibody (IgE) binding to the food instead of the pollen. Patients typically report itching and/or mild swelling of the mouth and throat immediately following ingestion of certain uncooked fruits (including nuts) or raw vegetables. Only a very small number of affected individuals experience systemic allergic reactions, such as anaphylaxis which occurs with true food allergies.      Environmental allergy -environmental allergy panel shows high IgE to molds; moderate IgE to dog dander; low IgE to dust mites, tree pollen, weed pollen.   -continue antihistamine as above  as needed for allergy symptom control  Follow-up in 3 months or sooner if needed  I appreciate the opportunity to take part in Taleya's care. Please do not hesitate to contact me with questions.  Sincerely,   Margo AyeShaylar Erica Osuna, MD Allergy/Immunology [facility]

## 2020-10-29 ENCOUNTER — Other Ambulatory Visit: Payer: Self-pay

## 2020-10-29 ENCOUNTER — Encounter: Payer: Self-pay | Admitting: Internal Medicine

## 2020-10-29 ENCOUNTER — Telehealth: Payer: Self-pay

## 2020-10-29 ENCOUNTER — Ambulatory Visit (INDEPENDENT_AMBULATORY_CARE_PROVIDER_SITE_OTHER): Payer: No Typology Code available for payment source | Admitting: Internal Medicine

## 2020-10-29 VITALS — BP 114/75 | HR 95 | Ht 65.0 in | Wt 167.4 lb

## 2020-10-29 DIAGNOSIS — T783XXD Angioneurotic edema, subsequent encounter: Secondary | ICD-10-CM

## 2020-10-29 DIAGNOSIS — L509 Urticaria, unspecified: Secondary | ICD-10-CM

## 2020-10-29 DIAGNOSIS — T783XXA Angioneurotic edema, initial encounter: Secondary | ICD-10-CM | POA: Insufficient documentation

## 2020-10-29 NOTE — Telephone Encounter (Signed)
Patient it scheduled to come in today, 10/29/2020 at 2 pm.

## 2020-10-29 NOTE — Telephone Encounter (Signed)
Patient called stating Dr. Dimple Casey wanted her to call when she was actively having a flair up.  Patient states she has swelling in her lips which is a new symptom.  Patient states she saw her allergy doctor last Thursday who prescribed Prednisone (4 pills) which helped with the swelling, but now it has come back.  Patient states she also is experiencing "sharp pains in her chest" and doesn't believe it is due to acid reflux.  Patient states she is experiencing hives on her legs and arms.  Please advise.

## 2020-10-29 NOTE — Telephone Encounter (Signed)
Yes I was interested in seeing if any more evidence for rheumatic disease when her symptoms returned. Would she be able to come to clinic tomorrow or very soon later this week to take a look? We can put anywhere open or can schedule lunch/beginning or end of day if needed.

## 2020-10-29 NOTE — Progress Notes (Signed)
Office Visit Note  Patient: Meagan Skinner             Date of Birth: 02-Oct-1991           MRN: [id number]             PCP: Leilani Able, MD Referring: Leilani Able, MD Visit Date: 10/29/2020   Subjective:  Chest Pain (Sharp chest pain the center of her chest, intermittent - worse after eating and worse while lying flat), Oral Swelling (Lip swelling, evaluated by allergist - patient took Prednisone at the allergist and it resolved, then she noticed new swelling on the bottom lip starting today), and Weight Loss (Unexplained/)   History of Present Illness: Meagan Skinner is a 31 y.o. female here for exacerbation of symptoms with new development of angioedema, sharp chest pain, and weight loss. She was pervously seen on account of her symptoms with positive ANA serology but repeat testing was negative. This current episode started 12/29 she only recalls drinking a Welch's juice drink with pineapple that is a known allergy for her. She took prednisone with improvement of her lip swelling but has had return of lower lip swelling currently. She had no active symptoms to be seen at last visit so was asked to return for repeat inspection.  Activities of Daily Living:  Patient reports morning stiffness for 30-60 minutes.   Patient Reports nocturnal pain.  Difficulty dressing/grooming: Denies Difficulty climbing stairs: Denies Difficulty getting out of chair: Denies Difficulty using hands for taps, buttons, cutlery, and/or writing: Denies  Review of Systems  Constitutional: Positive for fatigue.  HENT: Negative for mouth sores, mouth dryness and nose dryness.   Eyes: Negative for pain, itching, visual disturbance and dryness.  Respiratory: Negative for cough, hemoptysis, shortness of breath and difficulty breathing.   Cardiovascular: Positive for chest pain. Negative for palpitations and swelling in legs/feet.  Gastrointestinal: Negative for abdominal pain, blood in stool,  constipation and diarrhea.  Endocrine: Negative for increased urination.  Genitourinary: Negative for painful urination.  Musculoskeletal: Positive for arthralgias, joint pain, joint swelling and morning stiffness. Negative for myalgias, muscle weakness, muscle tenderness and myalgias.  Skin: Negative for color change, rash and redness.  Allergic/Immunologic: Negative for susceptible to infections.  Neurological: Positive for weakness. Negative for dizziness, numbness, headaches and memory loss.  Hematological: Positive for swollen glands.  Psychiatric/Behavioral: Positive for sleep disturbance. Negative for confusion.    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    Family History  Problem Relation Age of Onset   Diabetes Maternal Grandmother    Crohn's disease Maternal Grandmother    Fibromyalgia Maternal Grandmother    Heart disease Maternal Grandmother    Asthma Maternal Grandmother    Stroke Maternal Grandmother    Hypertension Maternal Grandmother    Rheum arthritis Maternal Grandmother    Cancer Maternal Grandfather    Asthma Sister    Heart disease Sister    Asthma Brother    Healthy Son    Past Surgical History:  Procedure Laterality Date   MULTIPLE TOOTH EXTRACTIONS     Social History   Social History Narrative   Not on file   Immunization History  Administered Date(s) Administered   PFIZER SARS-COV-2 Vaccination 05/31/2020, 06/21/2020     Objective: Vital Signs: BP 114/75 (BP Location: Right Arm, Patient Position:  Sitting, Cuff Size: Normal)    Pulse 95    Ht 5\' 5"  (1.651 m)    Wt 167 lb 6.4 oz (75.9 kg)    BMI 27.86 kg/m    Physical Exam HENT:     Head:     Comments: Central facial edema    Right Ear: External ear normal.     Left Ear: External ear normal.     Mouth/Throat:     Comments: Largely swollen bottom lip, moist oral mucosa without erythema Eyes:     Conjunctiva/sclera: Conjunctivae normal.  Cardiovascular:     Rate and Rhythm: Normal rate and regular rhythm.  Pulmonary:     Effort: Pulmonary effort is normal.     Breath sounds: Normal breath sounds.  Skin:    General: Skin is warm and dry.     Comments: Faintly indurated patches on extremities, no ecchymoses or erythema  Neurological:     Mental Status: She is alert.     Musculoskeletal Exam:  Wrist, fingers full range of motion no swelling Knees full range of motion no swelling  Investigation: No additional findings.  Imaging: No results found.  Recent Labs: Lab Results  Component Value Date   WBC 10.4 08/22/2020   HGB 13.3 08/22/2020   PLT 307 10/12/2018   NA 140 08/22/2020   K 4.6 08/22/2020   CL 103 08/22/2020   CO2 24 08/22/2020   GLUCOSE 91 08/22/2020   BUN 11 08/22/2020   CREATININE 0.82 08/22/2020   BILITOT 0.3 08/22/2020   ALKPHOS 50 08/22/2020   AST 20 08/22/2020   ALT 28 08/22/2020   PROT 7.7 08/22/2020   ALBUMIN 4.8 08/22/2020   CALCIUM 9.7 08/22/2020   GFRAA 112 08/22/2020    Speciality Comments: No specialty comments available.  Procedures:  No procedures performed Allergies: Patient has no known allergies.   Assessment / Plan:     Visit Diagnoses: Urticaria Angioedema, subsequent encounter  Repeat exam today does not look consistent with urticarial vasculitis. Facial edema and lip swelling is consistent with the angioedema. Combined with negative repeat ANA and normal complement tests do not suspect autoimmune cause of symptoms. Needs to follow up with allergy clinic but no  additional rheumatology testing at this time.  Orders: No orders of the defined types were placed in this encounter.  No orders of the defined types were placed in this encounter.    Follow-Up Instructions: Return if symptoms worsen or fail to improve.   08/24/2020, MD  Note - This record has been created using [employer].  Chart creation errors have been sought, but may not always  have been located. Such creation errors do not reflect on  the standard of medical care.

## 2020-10-30 ENCOUNTER — Other Ambulatory Visit: Payer: Self-pay | Admitting: Advanced Practice Midwife

## 2020-10-30 ENCOUNTER — Other Ambulatory Visit: Payer: Self-pay

## 2020-10-30 MED ORDER — VALACYCLOVIR HCL 1 G PO TABS
1000.0000 mg | ORAL_TABLET | Freq: Every day | ORAL | 11 refills | Status: DC
Start: 2020-10-30 — End: 2020-10-30

## 2020-10-30 MED FILL — valACYclovir HCL 1 GM TABS: 1 | 30 days supply | Qty: 30 | Fill #0

## 2020-11-13 ENCOUNTER — Other Ambulatory Visit: Payer: Self-pay | Admitting: Allergy

## 2020-11-13 ENCOUNTER — Other Ambulatory Visit: Payer: Self-pay | Admitting: *Deleted

## 2020-11-13 MED ORDER — PREDNISONE 10 MG PO TABS: 20.0000 mg | ORAL_TABLET | Freq: Every day | ORAL | 0 refills | Status: DC

## 2020-11-13 MED FILL — predniSONE 10 MG TABS: 10 | 7 days supply | Qty: 14 | Fill #0

## 2020-11-22 MED FILL — valACYclovir HCL 1 GM TABS: 1 | 30 days supply | Qty: 30 | Fill #0

## 2020-11-22 MED FILL — EPINEPHRINE 0.3 MG AUTO-INJ: 0.3 | 2 days supply | Qty: 2 | Fill #0

## 2020-12-10 ENCOUNTER — Other Ambulatory Visit: Payer: Self-pay | Admitting: *Deleted

## 2020-12-10 ENCOUNTER — Other Ambulatory Visit: Payer: Self-pay | Admitting: Allergy

## 2020-12-10 MED ORDER — MONTELUKAST SODIUM 10 MG PO TABS: 10.0000 mg | ORAL_TABLET | Freq: Every day | ORAL | 5 refills | Status: DC

## 2020-12-10 MED FILL — MONTELUKAST SOD 10 MG TAB: 10 | 30 days supply | Qty: 30 | Fill #0

## 2020-12-24 MED FILL — MONTELUKAST SOD 10 MG TAB: 10 | 30 days supply | Qty: 30 | Fill #0

## 2020-12-24 MED FILL — valACYclovir HCL 1 GM TABS: 1 | 30 days supply | Qty: 30 | Fill #1

## 2020-12-30 ENCOUNTER — Ambulatory Visit: Payer: No Typology Code available for payment source

## 2020-12-31 ENCOUNTER — Other Ambulatory Visit: Payer: Self-pay

## 2020-12-31 ENCOUNTER — Ambulatory Visit (INDEPENDENT_AMBULATORY_CARE_PROVIDER_SITE_OTHER): Payer: No Typology Code available for payment source | Admitting: *Deleted

## 2020-12-31 DIAGNOSIS — Z3042 Encounter for surveillance of injectable contraceptive: Secondary | ICD-10-CM

## 2020-12-31 DIAGNOSIS — Z3009 Encounter for other general counseling and advice on contraception: Secondary | ICD-10-CM

## 2020-12-31 NOTE — Progress Notes (Signed)
   Subjective:  Pt in for Depo Provera injection.    Objective: Need for contraception. No unusual complaints.    Assessment: Pt tolerated Depo injection. Depo given Left upper outer quadrant.   Plan:  Next injection due May 24-April 01, 2021.    Tenia Goh L, RN   

## 2021-01-02 ENCOUNTER — Other Ambulatory Visit (HOSPITAL_COMMUNITY)
Admission: RE | Admit: 2021-01-02 | Discharge: 2021-01-02 | Disposition: A | Discharge status: Home / Self Care | Payer: No Typology Code available for payment source | Source: Ambulatory Visit | Attending: Family Medicine | Admitting: Family Medicine

## 2021-01-02 ENCOUNTER — Other Ambulatory Visit: Payer: Self-pay

## 2021-01-02 ENCOUNTER — Ambulatory Visit (INDEPENDENT_AMBULATORY_CARE_PROVIDER_SITE_OTHER): Payer: No Typology Code available for payment source | Admitting: *Deleted

## 2021-01-02 VITALS — BP 118/85 | HR 71 | Temp 97.5°F | Wt 168.8 lb

## 2021-01-02 DIAGNOSIS — N898 Other specified noninflammatory disorders of vagina: Secondary | ICD-10-CM | POA: Insufficient documentation

## 2021-01-02 NOTE — Progress Notes (Signed)
   SUBJECTIVE:  30 y.o. female complains of creamy, odorless, thick and yellowish vaginal discharge and itching for 5 day(s). Denies abnormal vaginal bleeding or significant pelvic pain or fever. No UTI symptoms. Denies history of known exposure to STD.  No LMP recorded. Patient has had an injection.  OBJECTIVE:  She appears well, afebrile. Urine dipstick: not done.  ASSESSMENT:  Vaginal Discharge  Vaginal itching  PLAN:  GC, chlamydia, trichomonas, BVAG, CVAG probe sent to lab. Treatment: To be determined once lab results are received ROV prn if symptoms persist or worsen.  Clovis Pu, RN

## 2021-01-02 NOTE — Patient Instructions (Signed)
Alternative Vaginitis Therapies  1) soak in tub of warm water waist high with 1/2 cup of baking soda in water for ~ 20 mins. 2) soak 3 tampons in 1 tablespoon of fractionated (liquid form) coconut oil with 10 drops of Melaleuca (Tea Tree) essential oil, insert 1 saturated tampon vaginally at bedtime x 3 days. Both options are to be done after sexual intercourse, menses and when suspects Bacterial Vaginosis and/or yeast infection. Please be advised that these alternatives will not replace the need to be evaluated, if symptoms persist. You will need to seek care at an OB/GYN provider.  GO WHITE: Soap: UNSCENTED Dove (white box light green writing) Laundry detergent (underwear)- Dreft or [employer] unscented WHITE 100% cotton panties (NOT just cotton crouch) Sanitary napkin/panty liners: UNSCENTED.  If it doesn't SAY unscented it can have a scent/perfume    NO PERFUMES OR LOTIONS OR POTIONS in the vulvar area (may use regular [place]) Condoms: hypoallergenic only. Non dyed (no color) Toilet papers: white only Wash clothes: use a separate wash cloth. WHITE.  Wash in Dreft.   You can purchase Tea Tree Oil locally at:  [employer] [address] [place], [place] [address] [phone]  [employer] [address] , [place] [address] [phone]  

## 2021-01-05 LAB — CERVICOVAGINAL ANCILLARY ONLY
Bacterial Vaginitis (gardnerella): POSITIVE — AB
Candida Glabrata: NEGATIVE
Candida Vaginitis: POSITIVE — AB
Chlamydia: NEGATIVE
Comment: NEGATIVE
Comment: NEGATIVE
Comment: NEGATIVE
Comment: NEGATIVE
Comment: NEGATIVE
Comment: NORMAL
Neisseria Gonorrhea: NEGATIVE
Trichomonas: NEGATIVE

## 2021-01-06 ENCOUNTER — Telehealth: Payer: Self-pay | Admitting: *Deleted

## 2021-01-06 DIAGNOSIS — N760 Acute vaginitis: Secondary | ICD-10-CM

## 2021-01-06 MED ORDER — METRONIDAZOLE 500 MG PO TABS: 500.0000 mg | ORAL_TABLET | Freq: Two times a day (BID) | ORAL | 0 refills | Status: DC

## 2021-01-06 MED ORDER — FLUCONAZOLE 150 MG PO TABS: ORAL_TABLET | ORAL | 0 refills | Status: DC

## 2021-01-06 NOTE — Telephone Encounter (Signed)
-----   Message from Raelyn Mora, [place] sent at 01/06/2021  8:06 AM EDT ----- Please treat for BV and then yeast

## 2021-01-06 NOTE — Telephone Encounter (Signed)
Patient informed of +BV and yeast. Medication sent to pharmacy for both.  Clovis Pu, RN

## 2021-01-22 ENCOUNTER — Telehealth: Payer: Self-pay | Admitting: *Deleted

## 2021-01-22 ENCOUNTER — Other Ambulatory Visit: Payer: Self-pay | Admitting: Allergy

## 2021-01-22 MED ORDER — OMALIZUMAB 150 MG/ML ~~LOC~~ SOSY: 300.0000 mg | PREFILLED_SYRINGE | SUBCUTANEOUS | 11 refills | Status: DC

## 2021-01-22 NOTE — Telephone Encounter (Signed)
Called patient and advised approval, copay card and submit to [facility] for Xolair. Patient did have question regarding further testing and sent message to Dr Delorse Lek regarding same.

## 2021-01-23 ENCOUNTER — Telehealth: Payer: Self-pay | Admitting: Pharmacist

## 2021-01-23 NOTE — Telephone Encounter (Signed)
Called patient to schedule an appointment for the [facility]. I was unable to reach the patient so I left a HIPAA-compliant message requesting that the patient return my call.   Luke Van Ausdall, PharmD, BCACP, CPP Clinical Pharmacist [facility] [phone]  

## 2021-01-29 ENCOUNTER — Ambulatory Visit (INDEPENDENT_AMBULATORY_CARE_PROVIDER_SITE_OTHER): Payer: No Typology Code available for payment source | Admitting: Family Medicine

## 2021-01-29 ENCOUNTER — Encounter: Payer: Self-pay | Admitting: Family Medicine

## 2021-01-29 ENCOUNTER — Other Ambulatory Visit: Payer: Self-pay

## 2021-01-29 VITALS — BP 126/76 | HR 71 | Temp 98.1°F | Resp 18

## 2021-01-29 DIAGNOSIS — T783XXD Angioneurotic edema, subsequent encounter: Secondary | ICD-10-CM | POA: Diagnosis not present

## 2021-01-29 DIAGNOSIS — T781XXD Other adverse food reactions, not elsewhere classified, subsequent encounter: Secondary | ICD-10-CM

## 2021-01-29 DIAGNOSIS — L508 Other urticaria: Secondary | ICD-10-CM

## 2021-01-29 DIAGNOSIS — J3089 Other allergic rhinitis: Secondary | ICD-10-CM | POA: Diagnosis not present

## 2021-01-29 MED ORDER — CETIRIZINE HCL 10 MG PO TABS: 10.0000 mg | ORAL_TABLET | Freq: Two times a day (BID) | ORAL | 5 refills | Status: DC

## 2021-01-29 MED ORDER — FAMOTIDINE 20 MG PO TABS: ORAL_TABLET | Freq: Two times a day (BID) | ORAL | 5 refills | Status: DC

## 2021-01-29 NOTE — Patient Instructions (Signed)
Hives (urticaria) Use the least amount of medication while remaining hive free . Cetirizine (Zyrtec) 10mg  twice a day and famotidine (Pepcid) 20 mg twice a day. If no symptoms for 7-14 days then decrease to. . Cetirizine (Zyrtec) 10mg  twice a day and famotidine (Pepcid) 20 mg once a day.  If no symptoms for 7-14 days then decrease to. . Cetirizine (Zyrtec) 10mg  twice a day.  If no symptoms for 7-14 days then decrease to. . Cetirizine (Zyrtec) 10mg  once a day.  May use Benadryl (diphenhydramine) as needed for breakthrough hives       If symptoms return, then step up dosage Keep a detailed symptom journal including foods eaten, contact with allergens, medications taken, weather changes.   Allergic rhinitis Continue allergen avoidance measures directed toward dust mites, dog, mold, and pollens as listed below Continue cetirizine 10 mg once a day as needed for runny nose Continue Flonase 1 to 2 sprays in each nostril once a day as needed for stuffy nose.  In the right nostril, point the applicator out toward the right ear. In the left nostril, point the applicator out toward the left ear Consider saline nasal rinses as needed for nasal symptoms. Use this before any medicated nasal sprays for best result  Oral allergy syndrome Continue to avoid the foods that bother your mouth  Call the clinic if this treatment plan is not working well for you  Follow up in 3 months or sooner if needed.   Control of Dust Mite Allergen Dust mites play a major role in allergic asthma and rhinitis. They occur in environments with high humidity wherever human skin is found. Dust mites absorb humidity from the atmosphere (ie, they do not drink) and feed on organic matter (including shed human and animal skin). Dust mites are a microscopic type of insect that you cannot see with the naked eye. High levels of dust mites have been detected from mattresses, pillows, carpets, upholstered furniture, bed covers, clothes,  soft toys and any woven material. The principal allergen of the dust mite is found in its feces. A gram of dust may contain 1,000 mites and 250,000 fecal particles. Mite antigen is easily measured in the air during house cleaning activities. Dust mites do not bite and do not cause harm to humans, other than by triggering allergies/asthma.  Ways to decrease your exposure to dust mites in your home:  1. Encase mattresses, box springs and pillows with a mite-impermeable barrier or cover  2. Wash sheets, blankets and drapes weekly in hot water (130 F) with detergent and dry them in a dryer on the hot setting.  3. Have the room cleaned frequently with a vacuum cleaner and a damp dust-mop. For carpeting or rugs, vacuuming with a vacuum cleaner equipped with a high-efficiency particulate air (HEPA) filter. The dust mite allergic individual should not be in a room which is being cleaned and should wait 1 hour after cleaning before going into the room.  4. Do not sleep on upholstered furniture (eg, couches).  5. If possible removing carpeting, upholstered furniture and drapery from the home is ideal. Horizontal blinds should be eliminated in the rooms where the person spends the most time (bedroom, study, television room). Washable vinyl, roller-type shades are optimal.  6. Remove all non-washable stuffed toys from the bedroom. Wash stuffed toys weekly like sheets and blankets above.  7. Reduce indoor humidity to less than 50%. Inexpensive humidity monitors can be purchased at most hardware stores. Do not use a  humidifier as can make the problem worse and are not recommended.  Control of Dog or Cat Allergen Avoidance is the best way to manage a dog or cat allergy. If you have a dog or cat and are allergic to dog or cats, consider removing the dog or cat from the home. If you have a dog or cat but don't want to find it a new home, or if your family wants a pet even though someone in the household is  allergic, here are some strategies that may help keep symptoms at bay:  1. Keep the pet out of your bedroom and restrict it to only a few rooms. Be advised that keeping the dog or cat in only one room will not limit the allergens to that room. 2. Don't pet, hug or kiss the dog or cat; if you do, wash your hands with soap and water. 3. High-efficiency particulate air (HEPA) cleaners run continuously in a bedroom or living room can reduce allergen levels over time. 4. Regular use of a high-efficiency vacuum cleaner or a central vacuum can reduce allergen levels. 5. Giving your dog or cat a bath at least once a week can reduce airborne allergen.  Reducing Pollen Exposure The American Academy of Allergy, Asthma and Immunology suggests the following steps to reduce your exposure to pollen during allergy seasons. 6. Do not hang sheets or clothing out to dry; pollen may collect on these items. 7. Do not mow lawns or spend time around freshly cut grass; mowing stirs up pollen. 8. Keep windows closed at night.  Keep car windows closed while driving. 9. Minimize morning activities outdoors, a time when pollen counts are usually at their highest. 10. Stay indoors as much as possible when pollen counts or humidity is high and on windy days when pollen tends to remain in the air longer. 11. Use air conditioning when possible.  Many air conditioners have filters that trap the pollen spores. 12. Use a HEPA room air filter to remove pollen form the indoor air you breathe.

## 2021-01-29 NOTE — Progress Notes (Signed)
[address] [place] [address] Dept: [phone]  FOLLOW UP NOTE  Patient ID: Meagan Skinner, female    DOB: 29-Jul-1991  Age: 30 y.o. MRN: [id number] Date of Office Visit: 01/29/2021  Assessment  Chief Complaint: Follow-up (Bloodwork for foods )  HPI Meagan Skinner is a 30 year old female who presents to the clinic for follow-up visit.  She was last seen in this clinic on 10/24/2020 by Dr. Delorse Lek for evaluation of hives with angioedema, allergic rhinitis, and oral allergy syndrome.  At today's visit she reports her hives have been poorly controlled with breakouts occurring daily and lasting for several hours.  She reports that she notices hives appearing in areas where her clothing is tight such as waistband and wrist pants as well as areas not affected by pressure such as arms and buttocks.  She denies cardiopulmonary or gastrointestinal symptoms with these hives.  She continues cetirizine 10 mg on most days and montelukast 10 mg once a day at nighttime.  She occasionally takes famotidine as needed and reports this really helps to clear her hives.  She continues to avoid banana and pineapple with no accidental ingestion or EpiPen use since her last visit to this clinic.  She reports these foods make her tongue tingle in her throat itchy.  She reports allergic rhinitis has been well controlled with cetirizine as needed.  At today's visit, she is exploring the causes of hives and is curious if she should move forward with further testing for foods.  She does deny cardiopulmonary or gastrointestinal symptoms with these hives.  She reports that she is eating peanuts, tree nuts, fish, shellfish, sesame, soy, milk, and egg with no problems.  She does report that some fruits make her mouth itchy.  She is curious about effects and side effects of Xolair at this time.  She does have an appointment to begin Xolair injections in 6 days.  Previous testing was thoroughly reviewed at  today's visit.  Her current medications are listed in the chart.   Drug Allergies:  No Known Allergies  Physical Exam: BP 126/76   Pulse 71   Temp 98.1 F (36.7 C) (Temporal)   Resp 18   SpO2 98%    Physical Exam Vitals reviewed.  Constitutional:      Appearance: Normal appearance.  HENT:     Head: Normocephalic and atraumatic.     Right Ear: Tympanic membrane normal.     Left Ear: Tympanic membrane normal.     Nose:     Comments: Bilateral nares normal.  Pharynx normal.  Ears normal.  Eyes normal.    Mouth/Throat:     Pharynx: Oropharynx is clear.  Eyes:     Conjunctiva/sclera: Conjunctivae normal.  Cardiovascular:     Rate and Rhythm: Normal rate and regular rhythm.     Heart sounds: Normal heart sounds. No murmur heard.   Pulmonary:     Effort: Pulmonary effort is normal.     Breath sounds: Normal breath sounds.     Comments: Lungs clear to auscultation Musculoskeletal:        General: Normal range of motion.     Cervical back: Normal range of motion and neck supple.  Skin:    General: Skin is warm and dry.     Comments: No hives noted at today's visit however, she has several pictures on her phone that appear to be raised and reddened areas.  Neurological:     Mental Status: She is alert and oriented  to person, place, and time.  Psychiatric:        Mood and Affect: Mood normal.        Behavior: Behavior normal.        Thought Content: Thought content normal.        Judgment: Judgment normal.     Assessment and Plan: 1. Angioedema, subsequent encounter   2. Pollen-food allergy, subsequent encounter   3. Chronic urticaria   4. Non-seasonal allergic rhinitis due to other allergic trigger     Meds ordered this encounter  Medications  . famotidine (PEPCID) 20 MG tablet    Sig: TAKE 1 TABLET (20 MG TOTAL) BY MOUTH 2 (TWO) TIMES DAILY.    Dispense:  60 tablet    Refill:  5  . cetirizine (ZYRTEC) 10 MG tablet    Sig: Take 1 tablet (10 mg total) by mouth  2 (two) times daily.    Dispense:  60 tablet    Refill:  5    Patient Instructions  Hives (urticaria) Use the least amount of medication while remaining hive free . Cetirizine (Zyrtec) 10mg  twice a day and famotidine (Pepcid) 20 mg twice a day. If no symptoms for 7-14 days then decrease to. . Cetirizine (Zyrtec) 10mg  twice a day and famotidine (Pepcid) 20 mg once a day.  If no symptoms for 7-14 days then decrease to. . Cetirizine (Zyrtec) 10mg  twice a day.  If no symptoms for 7-14 days then decrease to. . Cetirizine (Zyrtec) 10mg  once a day.  May use Benadryl (diphenhydramine) as needed for breakthrough hives       If symptoms return, then step up dosage Keep a detailed symptom journal including foods eaten, contact with allergens, medications taken, weather changes.   Allergic rhinitis Continue allergen avoidance measures directed toward dust mites, dog, mold, and pollens as listed below Continue cetirizine 10 mg once a day as needed for runny nose Continue Flonase 1 to 2 sprays in each nostril once a day as needed for stuffy nose.  In the right nostril, point the applicator out toward the right ear. In the left nostril, point the applicator out toward the left ear Consider saline nasal rinses as needed for nasal symptoms. Use this before any medicated nasal sprays for best result  Oral allergy syndrome Continue to avoid the foods that bother your mouth  Call the clinic if this treatment plan is not working well for you  Follow up in 3 months or sooner if needed   Return in about 3 months (around 04/30/2021), or if symptoms worsen or fail to improve.    Thank you for the opportunity to care for this patient.  Please do not hesitate to contact me with questions.  , FNP [facility]

## 2021-01-30 ENCOUNTER — Other Ambulatory Visit (HOSPITAL_COMMUNITY): Payer: Self-pay

## 2021-02-03 ENCOUNTER — Other Ambulatory Visit (HOSPITAL_BASED_OUTPATIENT_CLINIC_OR_DEPARTMENT_OTHER): Payer: Self-pay

## 2021-02-03 ENCOUNTER — Other Ambulatory Visit (HOSPITAL_COMMUNITY): Payer: Self-pay

## 2021-02-04 ENCOUNTER — Ambulatory Visit: Payer: Self-pay

## 2021-02-05 ENCOUNTER — Ambulatory Visit: Payer: No Typology Code available for payment source | Admitting: Obstetrics and Gynecology

## 2021-02-13 ENCOUNTER — Ambulatory Visit: Payer: Self-pay

## 2021-03-12 ENCOUNTER — Other Ambulatory Visit: Payer: Self-pay | Admitting: *Deleted

## 2021-03-12 DIAGNOSIS — Z3042 Encounter for surveillance of injectable contraceptive: Secondary | ICD-10-CM

## 2021-03-12 MED ORDER — MEDROXYPROGESTERONE ACETATE 150 MG/ML IM SUSP
150.0000 mg | INTRAMUSCULAR | 1 refills | Status: DC
Start: 2021-03-12 — End: 2021-09-03

## 2021-03-12 NOTE — Progress Notes (Signed)
See mychart message.  Nesbit Michon L, RN  

## 2021-03-18 ENCOUNTER — Ambulatory Visit: Payer: No Typology Code available for payment source

## 2021-03-26 ENCOUNTER — Other Ambulatory Visit: Payer: Self-pay

## 2021-03-26 ENCOUNTER — Ambulatory Visit (INDEPENDENT_AMBULATORY_CARE_PROVIDER_SITE_OTHER): Payer: No Typology Code available for payment source | Admitting: *Deleted

## 2021-03-26 DIAGNOSIS — Z3042 Encounter for surveillance of injectable contraceptive: Secondary | ICD-10-CM

## 2021-03-26 DIAGNOSIS — Z3009 Encounter for other general counseling and advice on contraception: Secondary | ICD-10-CM | POA: Diagnosis not present

## 2021-03-26 NOTE — Progress Notes (Signed)
   Subjective:  Pt in for Depo Provera injection.    Objective: Need for contraception. No unusual complaints.    Assessment: Pt tolerated Depo injection. Depo given Right upper outer quadrant.   Plan:  Next injection due 06/11/21-06/25/21.  Do for annual.  Clovis Pu, RN

## 2021-03-31 ENCOUNTER — Other Ambulatory Visit (HOSPITAL_COMMUNITY): Payer: Self-pay

## 2021-04-21 ENCOUNTER — Other Ambulatory Visit (HOSPITAL_COMMUNITY): Payer: Self-pay

## 2021-04-21 MED FILL — Valacyclovir HCl Tab 1 GM: ORAL | 30 days supply | Qty: 30 | Fill #0 | Status: AC

## 2021-04-30 ENCOUNTER — Other Ambulatory Visit (HOSPITAL_COMMUNITY): Payer: Self-pay

## 2021-05-06 ENCOUNTER — Encounter: Payer: Self-pay | Admitting: Obstetrics and Gynecology

## 2021-06-09 ENCOUNTER — Ambulatory Visit: Payer: No Typology Code available for payment source

## 2021-06-16 ENCOUNTER — Ambulatory Visit: Payer: No Typology Code available for payment source

## 2021-06-17 ENCOUNTER — Ambulatory Visit (INDEPENDENT_AMBULATORY_CARE_PROVIDER_SITE_OTHER): Payer: No Typology Code available for payment source | Admitting: *Deleted

## 2021-06-17 ENCOUNTER — Other Ambulatory Visit: Payer: Self-pay

## 2021-06-17 ENCOUNTER — Other Ambulatory Visit (HOSPITAL_COMMUNITY)
Admission: RE | Admit: 2021-06-17 | Discharge: 2021-06-17 | Disposition: A | Discharge status: Home / Self Care | Payer: No Typology Code available for payment source | Source: Ambulatory Visit | Attending: Obstetrics and Gynecology | Admitting: Obstetrics and Gynecology

## 2021-06-17 VITALS — BP 120/77 | HR 80 | Temp 97.8°F | Ht 65.0 in | Wt 180.2 lb

## 2021-06-17 DIAGNOSIS — R8290 Unspecified abnormal findings in urine: Secondary | ICD-10-CM | POA: Insufficient documentation

## 2021-06-17 DIAGNOSIS — Z3042 Encounter for surveillance of injectable contraceptive: Secondary | ICD-10-CM | POA: Diagnosis not present

## 2021-06-17 DIAGNOSIS — Z3009 Encounter for other general counseling and advice on contraception: Secondary | ICD-10-CM

## 2021-06-17 LAB — POCT URINALYSIS DIPSTICK (MANUAL)
Nitrite, UA: POSITIVE — AB
Poct Bilirubin: NEGATIVE
Poct Glucose: NORMAL mg/dL
Poct Ketones: NEGATIVE
Poct Protein: NEGATIVE mg/dL
Poct Urobilinogen: 1 mg/dL — AB
Spec Grav, UA: 1.025 (ref 1.010–1.025)
pH, UA: 7 (ref 5.0–8.0)

## 2021-06-17 NOTE — Progress Notes (Signed)
   SUBJECTIVE: Meagan Skinner is a 30 y.o. female who complains of urinary odor x 7 days, without flank pain, fever, chills, or abnormal vaginal discharge or bleeding. Patient reported urine smell like ammonia.  Pt in for Depo Provera injection.    OBJECTIVE: Appears well, in no apparent distress.  Vital signs are normal. Urine dipstick shows positive for RBC's, positive for nitrates, and positive for leukocytes.   Need for contraception.  ASSESSMENT: Urine odor, yellow in color and turbid. Pt tolerated Depo injection. Depo given Right upper outer quadrant.  PLAN: Treatment per orders.  Call or return to clinic prn if these symptoms worsen or fail to improve as anticipated.   Next injection due 11/8-11/22/22.    Clovis Pu, RN

## 2021-06-18 LAB — CERVICOVAGINAL ANCILLARY ONLY
Bacterial Vaginitis (gardnerella): NEGATIVE
Candida Glabrata: NEGATIVE
Candida Vaginitis: NEGATIVE
Chlamydia: NEGATIVE
Comment: NEGATIVE
Comment: NEGATIVE
Comment: NEGATIVE
Comment: NEGATIVE
Comment: NEGATIVE
Comment: NORMAL
Neisseria Gonorrhea: NEGATIVE
Trichomonas: NEGATIVE

## 2021-06-19 LAB — URINE CULTURE

## 2021-06-23 ENCOUNTER — Other Ambulatory Visit: Payer: Self-pay | Admitting: *Deleted

## 2021-06-23 DIAGNOSIS — B9620 Unspecified Escherichia coli [E. coli] as the cause of diseases classified elsewhere: Secondary | ICD-10-CM

## 2021-06-23 MED ORDER — CEFADROXIL 500 MG PO CAPS: 500.0000 mg | ORAL_CAPSULE | Freq: Two times a day (BID) | ORAL | 0 refills | Status: AC

## 2021-07-11 ENCOUNTER — Ambulatory Visit: Payer: No Typology Code available for payment source | Admitting: Certified Nurse Midwife

## 2021-08-26 ENCOUNTER — Other Ambulatory Visit: Payer: Self-pay

## 2021-08-26 ENCOUNTER — Ambulatory Visit (HOSPITAL_COMMUNITY)
Admission: EM | Admit: 2021-08-26 | Discharge: 2021-08-26 | Disposition: A | Discharge status: Home / Self Care | Payer: No Typology Code available for payment source | Attending: Student | Admitting: Student

## 2021-08-26 ENCOUNTER — Encounter (HOSPITAL_COMMUNITY): Payer: Self-pay | Admitting: Emergency Medicine

## 2021-08-26 DIAGNOSIS — K047 Periapical abscess without sinus: Secondary | ICD-10-CM

## 2021-08-26 DIAGNOSIS — J069 Acute upper respiratory infection, unspecified: Secondary | ICD-10-CM

## 2021-08-26 MED ORDER — AMOXICILLIN-POT CLAVULANATE 875-125 MG PO TABS: 1.0000 | ORAL_TABLET | Freq: Two times a day (BID) | ORAL | 0 refills | Status: DC

## 2021-08-26 MED ORDER — PROMETHAZINE-DM 6.25-15 MG/5ML PO SYRP: 5.0000 mL | ORAL_SOLUTION | Freq: Four times a day (QID) | ORAL | 0 refills | Status: DC | PRN

## 2021-08-26 MED ORDER — IBUPROFEN 800 MG PO TABS: 800.0000 mg | ORAL_TABLET | Freq: Three times a day (TID) | ORAL | 0 refills | Status: DC

## 2021-08-26 NOTE — Discharge Instructions (Addendum)
-  Start the antibiotic-Augmentin (amoxicillin-clavulanate), 1 pill every 12 hours for 7 days.  You can take this with food like with breakfast and dinner. -Promethazine DM cough syrup for congestion/cough. This could make you drowsy, so take at night before bed. -You can take Tylenol up to 1000 mg 3 times daily, and ibuprofen up to 800 mg 3 times daily with food.  You can take these together, or alternate every 3-4 hours.

## 2021-08-26 NOTE — ED Provider Notes (Signed)
[facility]    CSN: [id number] Arrival date & time: 08/26/21  0857      History   Chief Complaint Chief Complaint  Patient presents with   Sore Throat   Cough    HPI Meagan Skinner is a 30 y.o. female presenting with viral syndrome for about 3 days following exposure to the flu.  Medical history noncontributory.  Here today with son who has similar symptoms.  Patient states that her sister did have the flu, but patient symptoms are actually fairly mild so far.  She also attended a home, event last weekend.  Describes nonproductive cough, congestion, sore throat, bilateral ear pressure and fullness for few days.  Has tried over-the-counter antipyretic with minimal improvement.  Has not monitored temperature at home.  Tolerating fluids and food.  Also notes left lower molar pain and tenderness for few days, states she needs to see her dentist.  Denies foul taste in mouth, pain under tongue, pain under her jaw, sore throat, trouble swallowing.  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   Urticaria     Patient Active Problem List   Diagnosis Date Noted   Angioedema 10/29/2020   Urticaria 09/26/2020   Normal labor 10/09/2016   Premature cervical dilation in third trimester 08/31/2016    Past Surgical History:  Procedure Laterality Date   MULTIPLE TOOTH EXTRACTIONS      OB History     Gravida  2   Para  1   Term  1   Preterm      AB  1   Living  1      SAB  0   IAB  1   Ectopic      Multiple  0   Live Births  1            Home Medications    Prior to Admission medications   Medication Sig Start Date End Date Taking? Authorizing Provider  amoxicillin-clavulanate (AUGMENTIN) 875-125 MG tablet Take 1  tablet by mouth every 12 (twelve) hours. 08/26/21  Yes Rhys Martini, PA-C  ibuprofen (ADVIL) 800 MG tablet Take 1 tablet (800 mg total) by mouth 3 (three) times daily. 08/26/21  Yes Rhys Martini, PA-C  promethazine-dextromethorphan (PROMETHAZINE-DM) 6.25-15 MG/5ML syrup Take 5 mLs by mouth 4 (four) times daily as needed for cough. 08/26/21  Yes Rhys Martini, PA-C  cetirizine (ZYRTEC) 10 MG tablet Take 1 tablet (10 mg total) by mouth 2 (two) times daily. Patient not taking: Reported on 03/26/2021 01/29/21   Hetty Blend, FNP  EPINEPHrine 0.3 mg/0.3 mL IJ SOAJ injection INJECT 0.3 MG INTO THE MUSCLE ONCE FOR 1 DOSE. Patient not taking: Reported on 03/26/2021 10/24/20 10/24/21  Marcelyn Bruins, MD  famotidine (PEPCID) 20 MG tablet TAKE 1 TABLET (20 MG TOTAL) BY MOUTH 2 (TWO) TIMES DAILY. Patient not taking: Reported on 03/26/2021 01/29/21 01/29/22  Hetty Blend, FNP  medroxyPROGESTERone (DEPO-PROVERA) 150 MG/ML injection Inject 1 mL (150 mg total) into the muscle every 3 (three) months. 03/12/21   Raelyn Mora, CNM  montelukast (SINGULAIR) 10 MG tablet TAKE 1 TABLET (10 MG TOTAL) BY MOUTH AT BEDTIME. Patient not  taking: Reported on 03/26/2021 12/10/20 12/10/21  Marcelyn Bruins, MD  omalizumab Geoffry Paradise) 150 MG/ML prefilled syringe INJECT 300 MG INTO THE SKIN EVERY 28 (TWENTY-EIGHT) DAYS. Patient not taking: Reported on 03/26/2021 01/22/21 01/22/22  Marcelyn Bruins, MD  valACYclovir (VALTREX) 1000 MG tablet TAKE 1 TABLET (1,000 MG TOTAL) BY MOUTH DAILY. Patient not taking: Reported on 03/26/2021 10/30/20 10/30/21  Armando Reichert, CNM    Family History Family History  Problem Relation Age of Onset   Diabetes Maternal Grandmother    Crohn's disease Maternal Grandmother    Fibromyalgia Maternal Grandmother    Heart disease Maternal Grandmother    Asthma Maternal Grandmother    Stroke Maternal Grandmother    Hypertension Maternal Grandmother    Rheum arthritis Maternal Grandmother     Cancer Maternal Grandfather    Asthma Sister    Heart disease Sister    Asthma Brother    Healthy Son     Social History Social History   Tobacco Use   Smoking status: Never   Smokeless tobacco: Never  Vaping Use   Vaping Use: Never used  Substance Use Topics   Alcohol use: Yes    Alcohol/week: 2.0 standard drinks    Types: 2 Glasses of wine per week    Comment: socially   Drug use: No     Allergies   Patient has no known allergies.   Review of Systems Review of Systems  Constitutional:  Negative for appetite change, chills and fever.  HENT:  Negative for congestion, ear pain, rhinorrhea, sinus pressure, sinus pain and sore throat.   Eyes:  Negative for redness and visual disturbance.  Respiratory:  Negative for cough, chest tightness, shortness of breath and wheezing.   Cardiovascular:  Negative for chest pain and palpitations.  Gastrointestinal:  Negative for abdominal pain, constipation, diarrhea, nausea and vomiting.  Genitourinary:  Negative for dysuria, frequency and urgency.  Musculoskeletal:  Negative for myalgias.  Neurological:  Negative for dizziness, weakness and headaches.  Psychiatric/Behavioral:  Negative for confusion.   All other systems reviewed and are negative.   Physical Exam Triage Vital Signs ED Triage Vitals [08/26/21 0942]  Enc Vitals Group     BP 118/81     Pulse Rate 81     Resp 17     Temp (!) 97.4 F (36.3 C)     Temp Source Oral     SpO2 100 %     Weight      Height      Head Circumference      Peak Flow      Pain Score 3     Pain Loc      Pain Edu?      Excl. in GC?    No data found.  Updated Vital Signs BP 118/81 (BP Location: Left Arm)   Pulse 81   Temp (!) 97.4 F (36.3 C) (Oral)   Resp 17   SpO2 100%   Visual Acuity Right Eye Distance:   Left Eye Distance:   Bilateral Distance:    Right Eye Near:   Left Eye Near:    Bilateral Near:     Physical Exam Vitals reviewed.  Constitutional:      General:  She is not in acute distress.    Appearance: Normal appearance. She is not ill-appearing, toxic-appearing or diaphoretic.  HENT:     Head: Normocephalic and atraumatic.     Jaw: There is normal jaw occlusion. No trismus, tenderness, swelling, pain  on movement or malocclusion.     Salivary Glands: Right salivary gland is not diffusely enlarged or tender. Left salivary gland is not diffusely enlarged or tender.     Right Ear: Hearing, tympanic membrane, ear canal and external ear normal. No tenderness. No middle ear effusion. There is no impacted cerumen. Tympanic membrane is not perforated, erythematous, retracted or bulging.     Left Ear: Hearing, tympanic membrane, ear canal and external ear normal. No tenderness.  No middle ear effusion. There is no impacted cerumen. Tympanic membrane is not perforated, erythematous, retracted or bulging.     Nose: Nose normal. No congestion.     Mouth/Throat:     Lips: Pink.     Mouth: Mucous membranes are moist. No lacerations or oral lesions.     Dentition: Abnormal dentition. Does not have dentures. Dental tenderness, gingival swelling and dental caries present.     Tongue: No lesions. Tongue does not deviate from midline.     Palate: No mass.     Pharynx: Oropharynx is clear. Uvula midline. No oropharyngeal exudate or posterior oropharyngeal erythema.     Tonsils: No tonsillar exudate or tonsillar abscesses.     Comments: L posterior molar with surrounding gingival swelling and tenderness. Poor dentician. No trismus, drooling, sore throat, voice changes, swelling underneath the tongue, swelling underneath the jaw, neck stiffness.  Eyes:     Extraocular Movements: Extraocular movements intact.     Pupils: Pupils are equal, round, and reactive to light.  Cardiovascular:     Rate and Rhythm: Normal rate and regular rhythm.     Heart sounds: Normal heart sounds.  Pulmonary:     Effort: Pulmonary effort is normal.     Breath sounds: Normal breath sounds.  No decreased breath sounds, wheezing, rhonchi or rales.  Abdominal:     Palpations: Abdomen is soft.     Tenderness: There is no abdominal tenderness. There is no guarding or rebound.  Lymphadenopathy:     Cervical: No cervical adenopathy.     Right cervical: No superficial cervical adenopathy.    Left cervical: No superficial cervical adenopathy.  Neurological:     General: No focal deficit present.     Mental Status: She is alert and oriented to person, place, and time.  Psychiatric:        Mood and Affect: Mood normal.        Behavior: Behavior normal.        Thought Content: Thought content normal.        Judgment: Judgment normal.     UC Treatments / Results  Labs (all labs ordered are listed, but only abnormal results are displayed) Labs Reviewed - No data to display  EKG   Radiology No results found.  Procedures Procedures (including critical care time)  Medications Ordered in UC Medications - No data to display  Initial Impression / Assessment and Plan / UC Course  I have reviewed the triage vital signs and the nursing notes.  Pertinent labs & imaging results that were available during my care of the patient were reviewed by me and considered in my medical decision making (see chart for details).     This patient is a very pleasant 30 y.o. year old female presenting with viral syndrome and dental pain. Today this pt is afebrile nontachycardic nontachypneic, oxygenating well on room air, no wheezes rhonchi or rales.   Augmentin sent. States she is not pregnant or breastfeeding. Depo for contraception.  For viral syndrome,  promethazine DM sent.  She declines COVID or flu testing as we are already testing her son.  ED return precautions discussed. Patient verbalizes understanding and agreement.    Final Clinical Impressions(s) / UC Diagnoses   Final diagnoses:  Viral URI with cough  Dental infection     Discharge Instructions      -Start the  antibiotic-Augmentin (amoxicillin-clavulanate), 1 pill every 12 hours for 7 days.  You can take this with food like with breakfast and dinner. -Promethazine DM cough syrup for congestion/cough. This could make you drowsy, so take at night before bed. -You can take Tylenol up to 1000 mg 3 times daily, and ibuprofen up to 800 mg 3 times daily with food.  You can take these together, or alternate every 3-4 hours.    ED Prescriptions     Medication Sig Dispense Auth. Provider   amoxicillin-clavulanate (AUGMENTIN) 875-125 MG tablet Take 1 tablet by mouth every 12 (twelve) hours. 14 tablet Hazel Sams, PA-C   ibuprofen (ADVIL) 800 MG tablet Take 1 tablet (800 mg total) by mouth 3 (three) times daily. 21 tablet Hazel Sams, PA-C   promethazine-dextromethorphan (PROMETHAZINE-DM) 6.25-15 MG/5ML syrup Take 5 mLs by mouth 4 (four) times daily as needed for cough. 118 mL Hazel Sams, PA-C      PDMP not reviewed this encounter.   Hazel Sams, PA-C 08/26/21 1034

## 2021-08-26 NOTE — ED Triage Notes (Signed)
Pt c/o cough, congestion, sore throat, bilat ear fullness for a couple days.

## 2021-09-02 ENCOUNTER — Ambulatory Visit: Payer: No Typology Code available for payment source

## 2021-09-03 ENCOUNTER — Ambulatory Visit (INDEPENDENT_AMBULATORY_CARE_PROVIDER_SITE_OTHER): Payer: No Typology Code available for payment source | Admitting: Certified Nurse Midwife

## 2021-09-03 ENCOUNTER — Other Ambulatory Visit (HOSPITAL_COMMUNITY)
Admission: RE | Admit: 2021-09-03 | Discharge: 2021-09-03 | Disposition: A | Discharge status: Home / Self Care | Payer: No Typology Code available for payment source | Source: Ambulatory Visit | Attending: Certified Nurse Midwife | Admitting: Certified Nurse Midwife

## 2021-09-03 ENCOUNTER — Other Ambulatory Visit: Payer: Self-pay

## 2021-09-03 VITALS — BP 118/81 | HR 66 | Temp 98.2°F | Ht 65.0 in | Wt 177.4 lb

## 2021-09-03 DIAGNOSIS — Z01419 Encounter for gynecological examination (general) (routine) without abnormal findings: Secondary | ICD-10-CM

## 2021-09-03 DIAGNOSIS — Z202 Contact with and (suspected) exposure to infections with a predominantly sexual mode of transmission: Secondary | ICD-10-CM

## 2021-09-03 DIAGNOSIS — N898 Other specified noninflammatory disorders of vagina: Secondary | ICD-10-CM | POA: Insufficient documentation

## 2021-09-03 DIAGNOSIS — R6882 Decreased libido: Secondary | ICD-10-CM

## 2021-09-03 DIAGNOSIS — Z3009 Encounter for other general counseling and advice on contraception: Secondary | ICD-10-CM

## 2021-09-03 MED ORDER — NORGESTIMATE-ETH ESTRADIOL 0.25-35 MG-MCG PO TABS: 1.0000 | ORAL_TABLET | Freq: Every day | ORAL | 11 refills | Status: DC

## 2021-09-03 NOTE — Progress Notes (Signed)
History:  [name] is a 30 y.o. G2P1011 who presents to clinic today for evaluation of itchy vaginal discharge. Was recently diagnosed with a tooth infection and put on amoxicillin. Has been taking the amoxicillin prn for pain, along with ibuprofen. Has noted an increase in thick white to off-white vaginal discharge and some vaginal itching over the past few days.    Also notes severely decreased libido since starting the depo shots for birth control. This is very bothersome to her, she never wants to have sex and isn't easily aroused when she does agree to it. Would like to discuss a different form of birth control.  The following portions of the patient's history were reviewed and updated as appropriate: allergies, current medications, family history, past medical history, social history, past surgical history and problem list.  Review of Systems:  Review of Systems  Constitutional: Negative.   HENT: Negative.    Eyes: Negative.   Respiratory: Negative.    Cardiovascular: Negative.   Gastrointestinal: Negative.   Genitourinary: Negative.   Musculoskeletal: Negative.   Skin: Negative.   Neurological: Negative.  Negative for headaches.  Endo/Heme/Allergies: Negative.   Psychiatric/Behavioral: Negative.    All other systems reviewed and are negative.  Objective:  Physical Exam BP 118/81 (BP Location: Left Arm, Patient Position: Sitting, Cuff Size: Normal)   Pulse 66   Temp 98.2 F (36.8 C) (Oral)   Ht 5\' 5"  (1.651 m)   Wt 177 lb 6.4 oz (80.5 kg)   LMP  (LMP Unknown) Comment: Not having Periods due to Depo  BMI 29.52 kg/m  CONSTITUTIONAL: Well-developed, well-nourished female in no acute distress.  HENT:  Normocephalic, atraumatic, External right and left ear normal.  EYES: Conjunctivae and EOM are normal. Pupils are equal, round, and reactive to light. No scleral icterus.  NECK: Normal range of motion, supple, no masses.  Normal thyroid.  SKIN: Skin is warm and  dry. No rash noted. Not diaphoretic. No erythema. No pallor. MUSCULOSKELETAL: Normal range of motion. No tenderness.  No cyanosis, clubbing, or edema. NEUROLOGIC: Alert and oriented to person, place, and time. Normal reflexes, muscle tone coordination.  PSYCHIATRIC: Normal mood and affect. Normal behavior. Normal judgment and thought content. CARDIOVASCULAR: Normal heart rate noted, regular rhythm RESPIRATORY: Clear to auscultation bilaterally. Effort and breath sounds normal, no problems with respiration noted. BREASTS: Symmetric in size. No masses, tenderness, skin changes, nipple drainage, or lymphadenopathy bilaterally.  ABDOMEN: Soft, no distention noted.  No tenderness, rebound or guarding.  PELVIC: Normal appearing external genitalia and urethral meatus; normal appearing vaginal mucosa and cervix.  No abnormal vaginal discharge noted.  Swabs collected, pap not collected today.  Assessment & Plan:  1. Encounter for annual routine gynecological examination - Counseled pt on importance of taking antibiotics as prescribed vs prn since that encourages resistance. Pt expressed understanding.  2. Exposure to sexually transmitted disease (STD) - Cervicovaginal ancillary only  3. Vaginal discharge - Cervicovaginal ancillary only - will follow and manage accordingly. Likely yeast but as no discharge noted during swab collection, will wait for results. - Discussed physiologic discharge increase after sex and transient BV after exposure to semen.  4. Vaginal itching - Cervicovaginal ancillary only  5. Decreased libido without sexual dysfunction - decrease in libido has been since use of depo, counseled on possible need for estrogen, pt amenable to switching birth control forms. No history of blood clots, hypertension or migraines.  6. Encounter for birth control, oral contraceptives - Sprintec sent to pharmacy  Follow up for annual exam or PRN.  Bernerd Limbo, CNM 09/03/2021 1:28  PM

## 2021-09-04 LAB — CERVICOVAGINAL ANCILLARY ONLY
Bacterial Vaginitis (gardnerella): POSITIVE — AB
Candida Glabrata: NEGATIVE
Candida Vaginitis: POSITIVE — AB
Chlamydia: NEGATIVE
Comment: NEGATIVE
Comment: NEGATIVE
Comment: NEGATIVE
Comment: NEGATIVE
Comment: NEGATIVE
Comment: NORMAL
Neisseria Gonorrhea: NEGATIVE
Trichomonas: NEGATIVE

## 2021-09-09 ENCOUNTER — Other Ambulatory Visit: Payer: Self-pay | Admitting: *Deleted

## 2021-09-09 DIAGNOSIS — N760 Acute vaginitis: Secondary | ICD-10-CM

## 2021-09-09 MED ORDER — FLUCONAZOLE 150 MG PO TABS: ORAL_TABLET | ORAL | 0 refills | Status: DC

## 2021-09-09 MED ORDER — METRONIDAZOLE 500 MG PO TABS: 500.0000 mg | ORAL_TABLET | Freq: Two times a day (BID) | ORAL | 0 refills | Status: DC

## 2021-09-09 NOTE — Progress Notes (Signed)
Patient called stating she did not receive medication for BV and yeast. Pt positive for  BV and yeast from 09/03/2021 visit. Medication sent to pharmacy per standing orders.  Clovis Pu, RN

## 2021-10-14 ENCOUNTER — Encounter: Payer: Self-pay | Admitting: Certified Nurse Midwife

## 2021-10-17 ENCOUNTER — Other Ambulatory Visit (HOSPITAL_COMMUNITY)
Admission: RE | Admit: 2021-10-17 | Discharge: 2021-10-17 | Disposition: A | Discharge status: Home / Self Care | Payer: No Typology Code available for payment source | Source: Ambulatory Visit | Attending: Certified Nurse Midwife | Admitting: Certified Nurse Midwife

## 2021-10-17 ENCOUNTER — Encounter: Payer: Self-pay | Admitting: Certified Nurse Midwife

## 2021-10-17 ENCOUNTER — Other Ambulatory Visit: Payer: Self-pay

## 2021-10-17 ENCOUNTER — Ambulatory Visit (INDEPENDENT_AMBULATORY_CARE_PROVIDER_SITE_OTHER): Payer: No Typology Code available for payment source | Admitting: Certified Nurse Midwife

## 2021-10-17 ENCOUNTER — Other Ambulatory Visit: Payer: Self-pay | Admitting: Obstetrics and Gynecology

## 2021-10-17 VITALS — BP 119/80 | HR 81 | Temp 98.2°F | Ht 65.0 in | Wt 171.8 lb

## 2021-10-17 DIAGNOSIS — N898 Other specified noninflammatory disorders of vagina: Secondary | ICD-10-CM | POA: Insufficient documentation

## 2021-10-17 DIAGNOSIS — N760 Acute vaginitis: Secondary | ICD-10-CM | POA: Diagnosis not present

## 2021-10-17 DIAGNOSIS — Z3042 Encounter for surveillance of injectable contraceptive: Secondary | ICD-10-CM

## 2021-10-19 MED ORDER — METRONIDAZOLE 0.75 % VA GEL: 1.0000 | Freq: Every day | VAGINAL | 11 refills | Status: DC

## 2021-10-19 NOTE — Progress Notes (Signed)
History:  Meagan Skinner is a 30 y.o. G2P1011 who presents to clinic today for recurrent vaginal discharge and odor that happens after coitus without a barrier method.   Diet/lifestyle review, pt desires to eat "healthier". Otherwise stays hydrated. Notes that her partner smokes regularly and wonders if his health is partially the cause of her recurrent BV.   The following portions of the patient's history were reviewed and updated as appropriate: allergies, current medications, family history, past medical history, social history, past surgical history and problem list.  Review of Systems:  Pertinent items noted in HPI and remainder of comprehensive ROS otherwise negative.   Objective:  Physical Exam BP 119/80 (BP Location: Left Arm, Patient Position: Sitting, Cuff Size: Normal)    Pulse 81    Temp 98.2 F (36.8 C) (Oral)    Ht 5\' 5"  (1.651 m)    Wt 171 lb 12.8 oz (77.9 kg)    BMI 28.59 kg/m  Physical Exam Vitals and nursing note reviewed.  Constitutional:      Appearance: Normal appearance. She is normal weight. She is not ill-appearing.  HENT:     Head: Normocephalic.  Eyes:     Pupils: Pupils are equal, round, and reactive to light.  Cardiovascular:     Rate and Rhythm: Normal rate.  Pulmonary:     Effort: Pulmonary effort is normal.  Genitourinary:    Vagina: Vaginal discharge present.  Musculoskeletal:        General: Normal range of motion.  Skin:    General: Skin is warm.     Capillary Refill: Capillary refill takes less than 2 seconds.  Neurological:     Mental Status: She is alert and oriented to person, place, and time.  Psychiatric:        Mood and Affect: Mood normal.        Behavior: Behavior normal.        Thought Content: Thought content normal.   Labs and Imaging No results found for this or any previous visit (from the past 24 hour(s)).  No results found.  Assessment & Plan:  1. Vaginal discharge - Discussed dietary changes to encourage  healthy vaginal flora. Encouraged her to talk to partner about smoking cessation as it does affect semen quality. - Cervicovaginal ancillary only( [place])  2. Recurrent vaginitis - Discussed various prevention methods, strongly encouraged use of post-coital semen sponges. Agreed to send in script for metrogel with refills the pt can use as needed. - metroNIDAZOLE (METROGEL VAGINAL) 0.75 % vaginal gel; Place 1 Applicatorful vaginally at bedtime.  Dispense: 70 g; Refill: 11  Follow up PRN or for annual exam.  , CNM 10/19/2021 3:35 PM

## 2021-10-22 LAB — CERVICOVAGINAL ANCILLARY ONLY
Bacterial Vaginitis (gardnerella): POSITIVE — AB
Candida Glabrata: NEGATIVE
Candida Vaginitis: POSITIVE — AB
Chlamydia: NEGATIVE
Comment: NEGATIVE
Comment: NEGATIVE
Comment: NEGATIVE
Comment: NEGATIVE
Comment: NEGATIVE
Comment: NORMAL
Neisseria Gonorrhea: NEGATIVE
Trichomonas: NEGATIVE

## 2021-10-27 MED ORDER — FLUCONAZOLE 150 MG PO TABS: ORAL_TABLET | ORAL | 1 refills | Status: DC

## 2021-10-27 NOTE — Addendum Note (Signed)
Addended by: Edd Arbour on: 10/27/2021 08:58 AM   Modules accepted: Orders

## 2021-11-05 ENCOUNTER — Encounter: Payer: Self-pay | Admitting: Certified Nurse Midwife

## 2021-11-05 ENCOUNTER — Other Ambulatory Visit: Payer: Self-pay

## 2021-11-05 ENCOUNTER — Ambulatory Visit (INDEPENDENT_AMBULATORY_CARE_PROVIDER_SITE_OTHER): Payer: Medicaid Other | Admitting: Certified Nurse Midwife

## 2021-11-05 VITALS — BP 120/80 | HR 58 | Ht 65.0 in | Wt 171.0 lb

## 2021-11-05 DIAGNOSIS — Z30011 Encounter for initial prescription of contraceptive pills: Secondary | ICD-10-CM | POA: Diagnosis not present

## 2021-11-05 DIAGNOSIS — N926 Irregular menstruation, unspecified: Secondary | ICD-10-CM

## 2021-11-05 LAB — POCT URINE PREGNANCY: Preg Test, Ur: NEGATIVE

## 2021-11-05 MED ORDER — NORGESTIMATE-ETH ESTRADIOL 0.25-35 MG-MCG PO TABS: 1.0000 | ORAL_TABLET | Freq: Every day | ORAL | 11 refills | Status: DC

## 2021-11-05 NOTE — Progress Notes (Signed)
History:  [name] is a 31 y.o. G2P1011 who presents to clinic initially for an IUD insertion but wanted to discuss a different method. She is graduating in May and needs a form of birth control until then, but wants to get pregnant once she's done with school. Also has not had a menstrual cycle since she stopped the Depo and then OCPs at the end of November.   The following portions of the patient's history were reviewed and updated as appropriate: allergies, current medications, family history, past medical history, social history, past surgical history and problem list.  Review of Systems:  Pertinent items noted in HPI and remainder of comprehensive ROS otherwise negative.   Objective:  Physical Exam BP 120/80 (BP Location: Left Arm, Patient Position: Sitting, Cuff Size: Normal)    Pulse (!) 58    Ht 5\' 5"  (1.651 m)    Wt 171 lb (77.6 kg)    LMP  (LMP Unknown)    BMI 28.46 kg/m  Physical Exam Vitals and nursing note reviewed.  Constitutional:      Appearance: Normal appearance.  HENT:     Head: Normocephalic and atraumatic.  Eyes:     Pupils: Pupils are equal, round, and reactive to light.  Cardiovascular:     Rate and Rhythm: Normal rate and regular rhythm.     Pulses: Normal pulses.  Pulmonary:     Effort: Pulmonary effort is normal.  Musculoskeletal:        General: Normal range of motion.  Skin:    General: Skin is warm and dry.     Capillary Refill: Capillary refill takes less than 2 seconds.  Neurological:     Mental Status: She is alert and oriented to person, place, and time.  Psychiatric:        Mood and Affect: Mood normal.        Behavior: Behavior normal.        Thought Content: Thought content normal.        Judgment: Judgment normal.   Labs and Imaging Results for orders placed or performed in visit on 11/05/21 (from the past 24 hour(s))  POCT urine pregnancy     Status: None   Collection Time: 11/05/21  2:54 PM  Result Value Ref Range    Preg Test, Ur Negative Negative   Assessment & Plan:  1. Encounter for prescription of oral contraceptives - Discussed timing of desired pregnancy (after May) and decided to try the OCPs again instead of doing an IUD insertion today.  - POCT urine pregnancy (completed in anticipation of IUD insertion) - norgestimate-ethinyl estradiol (SPRINTEC 28) 0.25-35 MG-MCG tablet; Take 1 tablet by mouth daily. Start on Sunday, take daily (even the placebo week).  Dispense: 28 tablet; Refill: 11  2. Irregular menses - Has not had a menstrual cycle since stopping Depo and trying OCPs which she stopped at the end of November.  - Dietary recall completed, pt only eats a couple of times per day and sporadically. Discussed how this affects ovulation and menses regularity, advised to work on dietary habits while on the pill to help regulate ovulation when she is ready to conceive.   Follow up PRN.  December, Bernerd Limbo 11/05/2021 4:25 PM

## 2021-12-15 ENCOUNTER — Encounter: Payer: Self-pay | Admitting: Certified Nurse Midwife

## 2022-01-02 ENCOUNTER — Ambulatory Visit: Payer: No Typology Code available for payment source

## 2022-03-16 ENCOUNTER — Encounter (HOSPITAL_COMMUNITY): Payer: Self-pay | Admitting: Emergency Medicine

## 2022-03-16 ENCOUNTER — Emergency Department (HOSPITAL_COMMUNITY)
Admission: EM | Admit: 2022-03-16 | Discharge: 2022-03-16 | Disposition: A | Discharge status: Home / Self Care | Payer: Medicaid Other | Attending: Emergency Medicine | Admitting: Emergency Medicine

## 2022-03-16 ENCOUNTER — Other Ambulatory Visit: Payer: Self-pay

## 2022-03-16 DIAGNOSIS — G43009 Migraine without aura, not intractable, without status migrainosus: Secondary | ICD-10-CM | POA: Insufficient documentation

## 2022-03-16 MED ORDER — KETOROLAC TROMETHAMINE 30 MG/ML IJ SOLN
30.0000 mg | Freq: Once | INTRAMUSCULAR | Status: AC
  Administered 2022-03-16: 30 mg via INTRAMUSCULAR
  Filled 2022-03-16: qty 1

## 2022-03-16 NOTE — ED Triage Notes (Signed)
Patient coming from home complaint of persistent migraine since Friday. States she took medications but nothing has helped. VSS. NAD.

## 2022-03-16 NOTE — ED Provider Notes (Signed)
[facility] EMERGENCY DEPARTMENT Provider Note   CSN: [id number] Arrival date & time: 03/16/22  1059     History  Chief Complaint  Patient presents with   Migraine    Meagan Skinner is a 31 y.o. female who presents emergency department with complaint of a headache.  Patient states that she has history of migraines, and has had this headache for the past 3 days.  She took Tylenol and Claritin yesterday morning without relief.  She states she does not get frequent migraines, but this 1 does feel similarly to once that she has had in the past.  She states her headache is made worse with bright lights.  No nausea or vomiting.   Migraine Associated symptoms include headaches.      Home Medications Prior to Admission medications   Medication Sig Start Date End Date Taking? Authorizing Provider  metroNIDAZOLE (METROGEL VAGINAL) 0.75 % vaginal gel Place 1 Applicatorful vaginally at bedtime. 10/19/21   Bernerd Limbo, CNM  norgestimate-ethinyl estradiol (SPRINTEC 28) 0.25-35 MG-MCG tablet Take 1 tablet by mouth daily. Start on Sunday, take daily (even the placebo week). 11/05/21   Walker, Judye Bos, CNM  omalizumab Geoffry Paradise) 150 MG/ML prefilled syringe INJECT 300 MG INTO THE SKIN EVERY 28 (TWENTY-EIGHT) DAYS. Patient not taking: Reported on 03/26/2021 01/22/21 01/22/22  Marcelyn Bruins, MD      Allergies    Patient has no known allergies.    Review of Systems   Review of Systems  Constitutional:  Negative for fever.  Eyes:  Positive for photophobia.  Gastrointestinal:  Negative for nausea and vomiting.  Neurological:  Positive for headaches. Negative for syncope, weakness and light-headedness.  All other systems reviewed and are negative.  Physical Exam Updated Vital Signs BP 114/75 (BP Location: Left Arm)   Pulse 83   Temp 98.1 F (36.7 C) (Oral)   Resp 17   SpO2 99%  Physical Exam Vitals and nursing note reviewed.  Constitutional:       Appearance: Normal appearance.  HENT:     Head: Normocephalic and atraumatic.  Eyes:     Conjunctiva/sclera: Conjunctivae normal.  Pulmonary:     Effort: Pulmonary effort is normal. No respiratory distress.  Skin:    General: Skin is warm and dry.  Neurological:     Mental Status: She is alert.     Comments: Neuro: Speech is clear, able to follow commands. CN III-XII intact grossly intact. PERRLA. EOMI. Sensation intact throughout. Str 5/5 all extremities.  Psychiatric:        Mood and Affect: Mood normal.        Behavior: Behavior normal.    ED Results / Procedures / Treatments   Labs (all labs ordered are listed, but only abnormal results are displayed) Labs Reviewed - No data to display  EKG None  Radiology No results found.  Procedures Procedures    Medications Ordered in ED Medications  ketorolac (TORADOL) 30 MG/ML injection 30 mg (30 mg Intramuscular Given 03/16/22 1431)    ED Course/ Medical Decision Making/ A&P                           Medical Decision Making Risk Prescription drug management.  This patient is a 31 y.o. female who presents to the ED for concern of headache, this involves an extensive number of treatment options, and is a complaint that carries with it a high risk of complications and morbidity. The  emergent differential diagnosis prior to evaluation includes, but is not limited to,  Intracranial hemorrhage, meningitis, CVA, intracranial tumor, venous sinus thrombosis, migraine, cluster headache, hypertension, withdrawal (caffeine, alcohol, drug), pseudotumor cerebri, AVM, head injury, tension headache, sinusitis, dental abscess, otitis media, TMJ, depression. This is not an exhaustive differential.   Past Medical History / Co-morbidities / Social History: Headache, depression  Physical Exam: Physical exam performed. The pertinent findings include: Normal vital signs. Normal neurologic exam as above.   Medications: I ordered medication  including IM toradol  for headache.  Offered IV migraine cocktail, but patient wanted just a shot instead.  Reevaluation of the patient after these medicines showed that the patient resolved. I have reviewed the patients home medicines and have made adjustments as needed.  Disposition: After consideration of the diagnostic results and the patients response to treatment, I feel that patient's not requiring admission or inpatient treatment for her symptoms.  Suspect her headache was likely related to her typical migraine.  Symptoms have improved on my reevaluation.  Low suspicion for acute intracranial pathology as patient is clinically well-appearing, with a normal neurologic exam.  Will discharge with recommendation to follow-up with PCP about migraine management, and discussed reasons to return to the emergency department.  Patient's agreeable to the plan.  Final Clinical Impression(s) / ED Diagnoses Final diagnoses:  Migraine without aura and without status migrainosus, not intractable    Rx / DC Orders ED Discharge Orders     None      Portions of this report may have been transcribed using voice recognition software. Every effort was made to ensure accuracy; however, inadvertent computerized transcription errors may be present.    Ulrick Methot T, PA-C 03/16/22 1508    Rozelle Logan, DO 03/17/22 1640

## 2022-03-16 NOTE — Discharge Instructions (Addendum)
You are seen in the emergency department for migraine.  We gave you a shot of medicine called Toradol, which is a strong anti-inflammatory medicine.  I recommend mentioning your migraines to your primary doctor, because there is several medications nowadays with over-the-counter and prescription that you could try.  Continue to monitor how you're doing and return to the ER for new or worsening symptoms.

## 2022-03-16 NOTE — ED Provider Triage Note (Signed)
Emergency Medicine Provider Triage Evaluation Note  Meagan Skinner , a 31 y.o. female  was evaluated in triage.  Pt complains of migraine x 3 days. Took tylenol and claritin yesterday morning without relief.  Reports she does not get frequent migraines, but this 1 does feel like the ones that she has had in the past.  Symptoms made worse with bright lights.  Review of Systems  Positive: As above Negative: Nausea, vomiting, syncope, weakness  Physical Exam  BP 114/75 (BP Location: Left Arm)   Pulse 83   Temp 98.1 F (36.7 C) (Oral)   Resp 17   SpO2 99%  Gen:   Awake, no distress   Resp:  Normal effort  MSK:   Moves extremities without difficulty  Other:    Medical Decision Making  Medically screening exam initiated at 11:30 AM.  Appropriate orders placed.  Dennise Finney-Jones was informed that the remainder of the evaluation will be completed by another provider, this initial triage assessment does not replace that evaluation, and the importance of remaining in the ED until their evaluation is complete.    Tamira Ryland T, PA-C 03/16/22 1133

## 2022-03-16 NOTE — ED Notes (Signed)
RN reviewed discharge instructions w/ pt. Follow up and pain management reviewed, pt had no further questions. 

## 2022-12-08 ENCOUNTER — Ambulatory Visit (INDEPENDENT_AMBULATORY_CARE_PROVIDER_SITE_OTHER): Payer: Medicaid Other | Admitting: Primary Care

## 2022-12-08 ENCOUNTER — Encounter (INDEPENDENT_AMBULATORY_CARE_PROVIDER_SITE_OTHER): Payer: Self-pay | Admitting: Primary Care

## 2022-12-08 VITALS — BP 130/87 | HR 86 | Resp 16 | Ht 65.0 in | Wt 164.0 lb

## 2022-12-08 DIAGNOSIS — R030 Elevated blood-pressure reading, without diagnosis of hypertension: Secondary | ICD-10-CM | POA: Diagnosis not present

## 2022-12-08 DIAGNOSIS — H6122 Impacted cerumen, left ear: Secondary | ICD-10-CM

## 2022-12-08 DIAGNOSIS — Z823 Family history of stroke: Secondary | ICD-10-CM | POA: Diagnosis not present

## 2022-12-08 DIAGNOSIS — Z833 Family history of diabetes mellitus: Secondary | ICD-10-CM

## 2022-12-08 DIAGNOSIS — N921 Excessive and frequent menstruation with irregular cycle: Secondary | ICD-10-CM

## 2022-12-08 DIAGNOSIS — Z30013 Encounter for initial prescription of injectable contraceptive: Secondary | ICD-10-CM

## 2022-12-08 DIAGNOSIS — Z6827 Body mass index (BMI) 27.0-27.9, adult: Secondary | ICD-10-CM

## 2022-12-08 DIAGNOSIS — E663 Overweight: Secondary | ICD-10-CM | POA: Diagnosis not present

## 2022-12-08 DIAGNOSIS — Z7689 Persons encountering health services in other specified circumstances: Secondary | ICD-10-CM

## 2022-12-08 DIAGNOSIS — Z3202 Encounter for pregnancy test, result negative: Secondary | ICD-10-CM

## 2022-12-08 LAB — POCT URINE PREGNANCY: Preg Test, Ur: NEGATIVE

## 2022-12-08 MED ORDER — MEDROXYPROGESTERONE ACETATE 150 MG/ML IM SUSP
150.0000 mg | Freq: Once | INTRAMUSCULAR | Status: AC
  Administered 2022-12-08: 150 mg via INTRAMUSCULAR

## 2022-12-08 NOTE — Patient Instructions (Addendum)
Preventing Hypertension Hypertension, also called high blood pressure, is when the force of blood pumping through the arteries is too strong. Arteries are blood vessels that carry blood from the heart throughout the body. Often, hypertension does not cause symptoms until blood pressure is very high. It is important to have your blood pressure checked regularly. Diet and lifestyle changes can help you prevent hypertension, and they may make you feel better overall and improve your quality of life. If you already have hypertension, you may control it with diet and lifestyle changes, as well as with medicine. How can this condition affect me? Over time, hypertension can damage the arteries and decrease blood flow to important parts of the body, including the brain, heart, and kidneys. By keeping your blood pressure in a healthy range, you can help prevent complications like heart attack, heart failure, stroke, kidney failure, and vascular dementia. What can increase my risk? An unhealthy diet and a lack of physical activity can make you more likely to develop high blood pressure. Some other risk factors include: Age. The risk increases with age. Having family members who have had high blood pressure. Having certain health conditions, such as thyroid problems. Being overweight or obese. Drinking too much alcohol or caffeine. Having too much fat, sugar, calories, or salt (sodium) in your diet. Smoking or using illegal drugs. Taking certain medicines, such as antidepressants, decongestants, birth control pills, and NSAIDs, such as ibuprofen. What actions can I take to prevent or manage this condition? Work with your health care provider to make a hypertension prevention plan that works for you. You may be referred for counseling on a healthy diet and physical activity. Follow your plan and keep all follow-up visits. Diet changes Maintain a healthy diet. This includes: Eating less salt (sodium). Ask your  health care provider how much sodium is safe for you to have. The general recommendation is to have less than 1 tsp (2,300 mg) of sodium a day. Do not add salt to your food. Choose low-sodium options when grocery shopping and eating out. Limiting fats in your diet. You can do this by eating low-fat or fat-free dairy products and by eating less red meat. Eating more fruits, vegetables, and whole grains. Make a goal to eat: 1-2 cups of fresh fruits and vegetables each day. 3-4 servings of whole grains each day. Avoiding foods and beverages that have added sugars. Eating fish that contain healthy fats (omega-3 fatty acids), such as mackerel or salmon. If you need help putting together a healthy eating plan, try the DASH diet. This diet is high in fruits, vegetables, and whole grains. It is low in sodium, red meat, and added sugars. DASH stands for Dietary Approaches to Stop Hypertension. Lifestyle changes  Lose weight if you are overweight. Losing just 3-5% of your body weight can help prevent or control hypertension. For example, if your present weight is 200 lb (91 kg), a loss of 3-5% of your weight means losing 6-10 lb (2.7-4.5 kg). Ask your health care provider to help you with a diet and exercise plan to safely lose weight. Get enough exercise. Do at least 150 minutes of moderate-intensity exercise each week. You could do this in short exercise sessions several times a day, or you could do longer exercise sessions a few times a week. For example, you could take a brisk 10-minute walk or bike ride, 3 times a day, for 5 days a week. Find ways to reduce stress, such as exercising, meditating, listening to  music, or taking a yoga class. If you need help reducing stress, ask your health care provider. Do not use any products that contain nicotine or tobacco. These products include cigarettes, chewing tobacco, and vaping devices, such as e-cigarettes. Chemicals in tobacco and nicotine products raise your  blood pressure each time you use them. If you need help quitting, ask your health care provider. Learn how to check your blood pressure at home. Make sure that you know your personal target blood pressure, as told by your health care provider. Try to sleep 7-9 hours per night. Alcohol use Do not drink alcohol if: Your health care provider tells you not to drink. You are pregnant, may be pregnant, or are planning to become pregnant. If you drink alcohol: Limit how much you have to: 0-1 drink a day for women. 0-2 drinks a day for men. Know how much alcohol is in your drink. In the [place] one drink equals one 12 oz bottle of beer (355 mL), one 5 oz glass of wine (148 mL), or one 1 oz glass of hard liquor (44 mL). Medicines In addition to diet and lifestyle changes, your health care provider may recommend medicines to help lower your blood pressure. In general: You may need to try a few different medicines to find what works best for you. You may need to take more than one medicine. Take over-the-counter and prescription medicines only as told by your health care provider. Questions to ask your health care provider What is my blood pressure goal? How can I lower my risk for high blood pressure? How should I monitor my blood pressure at home? Where to find support Your health care provider can help you prevent hypertension and help you keep your blood pressure at a healthy level. Your local hospital or your community may also provide support services and prevention programs. The American Heart Association offers an online support network at supportnetwork.heart.org Where to find more information Learn more about hypertension from: [place], Lung, and [place]: https://wilson-eaton.com/ Centers for Disease Control and Prevention: http://www.wolf.info/ American Academy of Family Physicians: familydoctor.org Learn more about the DASH diet from: [place], Lung, and [place]:  https://wilson-eaton.com/ Contact a health care provider if: You think you are having a reaction to medicines you have taken. You have recurrent headaches or feel dizzy. You have swelling in your ankles. You have trouble with your vision. Get help right away if: You have sudden, severe chest, back, or abdominal pain or discomfort. You have shortness of breath. You have a sudden, severe headache. These symptoms may be an emergency. Get help right away. Call 911. Do not wait to see if the symptoms will go away. Do not drive yourself to the hospital. Summary Hypertension often does not cause any symptoms until blood pressure is very high. It is important to get your blood pressure checked regularly. Diet and lifestyle changes are important steps in preventing hypertension. By keeping your blood pressure in a healthy range, you may prevent complications like heart attack, heart failure, stroke, and kidney failure. Work with your health care provider to make a hypertension prevention plan that works for you. This information is not intended to replace advice given to you by your health care provider. Make sure you discuss any questions you have with your health care provider. Document Revised: 07/31/2021 Document Reviewed: 07/31/2021 Elsevier Patient Education  [place]. Medroxyprogesterone Injection (Malignancy) What is this medication? MEDROXYPROGESTERONE (me DROX ee proe JES te rone) is a  man-made hormone. It is used to treat the symptoms of endometrial and renal cancer. This medicine may be used for other purposes; ask your health care provider or pharmacist if you have questions. This medicine may be used for other purposes; ask your health care provider or pharmacist if you have questions. COMMON BRAND NAME(S): Depo-Provera What should I tell my care team before I take this medication? They need to know if you have any of these conditions: Blood vessel disease Breast cancer History  of a blood clot in the lungs or legs Liver disease Mental depression Migraine Seizures Stroke Vaginal bleeding An unusual or allergic reaction to medroxyprogesterone, other hormones, medications, foods, dyes, or preservatives Pregnant or trying to get pregnant Breast-feeding How should I use this medication? This medication is for injection into a muscle. It is given in a hospital or clinic setting. Talk to your care team about the use of this medication in children. Special care may be needed. Overdosage: If you think you have taken too much of this medicine contact a poison control center or emergency room at once. NOTE: This medicine is only for you. Do not share this medicine with others. What if I miss a dose? It is important not to miss your dose. Call your care team if you are unable to keep an appointment. What may interact with this medication? Do not take this medication with any of the following: Bosentan This medication may also interact with the following: Aminoglutethimide Antibiotics or medications for infections, especially rifampin, rifabutin, rifapentine, and griseofulvin Aprepitant Barbiturate medications such as phenobarbital or primidone Bexarotene Carbamazepine Medications for seizures like ethotoin, felbamate, oxcarbazepine, phenytoin, topiramate Modafinil St. John's wort This list may not describe all possible interactions. Give your health care provider a list of all the medicines, herbs, non-prescription drugs, or dietary supplements you use. Also tell them if you smoke, drink alcohol, or use illegal drugs. Some items may interact with your medicine. What should I watch for while using this medication? Your condition will be monitored carefully while you are receiving this medication. Talk to your care team if you wish to become pregnant or think you might be pregnant. This medication can cause serious birth defects. What side effects may I notice from  receiving this medication? Side effects that you should report to your doctor or health care professional as soon as possible: allergic reactions like skin rash, itching or hives, swelling of the face, lips, or tongue breast tenderness or discharge breathing problems changes in vision depression feeling faint or lightheaded, falls fever pain in the abdomen, chest, groin, or leg problems with balance, talking, walking unusually weak or tired yellowing of the eyes or skin Side effects that usually do not require medical attention (report to your doctor or health care professional if they continue or are bothersome): fluid retention, swelling headache irregular periods, spotting, or absent periods irritation at site where injected nausea trouble sleeping weight gain or loss This list may not describe all possible side effects. Call your doctor for medical advice about side effects. You may report side effects to FDA at 1-800-FDA-1088. This list may not describe all possible side effects. Call your doctor for medical advice about side effects. You may report side effects to FDA at 1-800-FDA-1088. Where should I keep my medication? This medication is given in a hospital or clinic and will not be stored at home. NOTE: This sheet is a summary. It may not cover all possible information. If you have questions about  this medicine, talk to your doctor, pharmacist, or health care provider.  2023 Elsevier/Gold Standard (2021-08-01 00:00:00)

## 2022-12-08 NOTE — Progress Notes (Signed)
New Patient Office Visit  Subjective    Patient ID: Meagan Skinner, female    DOB: 1991/02/10  Age: 32 y.o. MRN: [id number]  CC:  Chief Complaint  Patient presents with   New Patient (Initial Visit)    HPI [name] is a 32 year old female presents to establish care. She will like to discuss birth control options.   Outpatient Encounter Medications as of 12/08/2022  Medication Sig   metroNIDAZOLE (METROGEL VAGINAL) 0.75 % vaginal gel Place 1 Applicatorful vaginally at bedtime. (Patient not taking: Reported on 12/08/2022)   omalizumab (XOLAIR) 150 MG/ML prefilled syringe INJECT 300 MG INTO THE SKIN EVERY 28 (TWENTY-EIGHT) DAYS. (Patient not taking: Reported on 03/26/2021)   [DISCONTINUED] norgestimate-ethinyl estradiol (SPRINTEC 28) 0.25-35 MG-MCG tablet Take 1 tablet by mouth daily. Start on Sunday, take daily (even the placebo week). (Patient not taking: Reported on 12/08/2022)   [EXPIRED] medroxyPROGESTERone (DEPO-PROVERA) injection 150 mg    No facility-administered encounter medications on file as of 12/08/2022.    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   Urticaria     Past Surgical History:  Procedure Laterality Date   MULTIPLE TOOTH EXTRACTIONS      Family History  Problem Relation Age of Onset   Diabetes Maternal Grandmother    Crohn's disease Maternal Grandmother    Fibromyalgia Maternal Grandmother    Heart disease Maternal Grandmother    Asthma Maternal Grandmother    Stroke Maternal Grandmother    Hypertension Maternal Grandmother    Rheum arthritis Maternal Grandmother    Cancer Maternal Grandfather    Asthma Sister    Heart disease Sister    Asthma Brother    Healthy Son     Social History    Socioeconomic History   Marital status: Single    Spouse name: Not on file   Number of children: Not on file   Years of education: Not on file   Highest education level: Not on file  Occupational History   Not on file  Tobacco Use   Smoking status: Never   Smokeless tobacco: Never  Vaping Use   Vaping Use: Never used  Substance and Sexual Activity   Alcohol use: Yes    Alcohol/week: 2.0 standard drinks of alcohol    Types: 2 Glasses of wine per week    Comment: socially   Drug use: No   Sexual activity: Not Currently    Birth control/protection: Condom  Other Topics Concern   Not on file  Social History Narrative   Not on file   Social Determinants of Health   Financial Resource Strain: Low Risk  (09/03/2021)   Overall Financial Resource Strain (CARDIA)    Difficulty of Paying Living Expenses: Not hard at all  Food Insecurity: No Food Insecurity (09/03/2021)   Hunger Vital Sign    Worried About Running Out of Food in the Last Year: Never true    Ran Out of Food in the Last Year: Never true  Transportation Needs: No Transportation Needs (09/03/2021)   PRAPARE - Hydrologist (Medical): No    Lack of Transportation (Non-Medical): No  Physical Activity: Inactive (09/03/2021)   Exercise  Vital Sign    Days of Exercise per Week: 0 days    Minutes of Exercise per Session: 0 min  Stress: No Stress Concern Present (09/03/2021)   [place]    Feeling of Stress : Not at all  Social Connections: Moderately Integrated (09/03/2021)   Social Connection and Isolation Panel [NHANES]    Frequency of Communication with Friends and Family: More than three times a week    Frequency of Social Gatherings with Friends and Family: More than three times a week    Attends Religious Services: More than 4 times per year    Active Member of [employer] or Organizations: No    Attends Archivist  Meetings: Never    Marital Status: Living with partner  Intimate Partner Violence: Not At Risk (09/03/2021)   Humiliation, Afraid, Rape, and Kick questionnaire    Fear of Current or Ex-Partner: No    Emotionally Abused: No    Physically Abused: No    Sexually Abused: No    ROS Comprehensive ROS Pertinent positive and negative noted in HPI     Objective    Blood Pressure 130/87   Pulse 86   Respiration 16   Height 5' 5"$  (1.651 m)   Weight 164 lb (74.4 kg)   Last Menstrual Period 11/20/2022   Oxygen Saturation 97%   Body Mass Index 27.29 kg/m   Physical Exam Vitals reviewed.  Constitutional:      Comments: Over weight  HENT:     Head: Normocephalic.     Right Ear: Tympanic membrane and external ear normal.     Left Ear: Tympanic membrane and external ear normal.     Nose: Nose normal.  Eyes:     Extraocular Movements: Extraocular movements intact.     Pupils: Pupils are equal, round, and reactive to light.  Cardiovascular:     Rate and Rhythm: Normal rate and regular rhythm.  Pulmonary:     Effort: Pulmonary effort is normal.     Breath sounds: Normal breath sounds.  Abdominal:     General: Abdomen is flat. Bowel sounds are normal.     Palpations: Abdomen is soft.  Musculoskeletal:        General: Normal range of motion.     Cervical back: Normal range of motion.  Skin:    General: Skin is warm and dry.  Neurological:     Mental Status: She is alert and oriented to person, place, and time.  Psychiatric:        Mood and Affect: Mood normal.        Behavior: Behavior normal.        Thought Content: Thought content normal.      Assessment & Plan:  Meagan Skinner was seen today for new patient (initial visit).  Diagnoses and all orders for this visit:  Encounter to establish care  Overweight (BMI 25.0-29.9) Discussed diet and exercise for person with BMI >25. Instructed: You must burn more calories than you eat. Losing 5 percent of your body weight should be  considered a success. In the longer term, losing more than 15 percent of your body weight and staying at this weight is an extremely good result. However, keep in mind that even losing 5 percent of your body weight leads to important health benefits, so try not to get discouraged if you're not able to lose more than this. Will recheck weight in 3-6 months.   Elevated blood  pressure reading without diagnosis of hypertension AVS provided to prevent HTN -     CMP14+EGFR  Family history of diabetes mellitus (DM) -     Hemoglobin A1c  Family history of cerebrovascular accident (CVA) -     Lipid panel  Hearing loss of left ear due to cerumen impaction -     Ear Lavage future  Menorrhagia with irregular cycle -     CBC with Differential/Platelet  Encounter for initial prescription of injectable contraceptive -     medroxyPROGESTERone (DEPO-PROVERA) injection 150 mg -     POCT urine pregnancy  Return for next depo due between May 1-May 15.   Kerin Perna, NP

## 2022-12-09 LAB — LIPID PANEL
Chol/HDL Ratio: 2.2 ratio (ref 0.0–4.4)
Cholesterol, Total: 137 mg/dL (ref 100–199)
HDL: 63 mg/dL (ref >39)
LDL Chol Calc (NIH): 53 mg/dL (ref 0–99)
Triglycerides: 117 mg/dL (ref 0–149)
VLDL Cholesterol Cal: 21 mg/dL (ref 5–40)

## 2022-12-09 LAB — CBC WITH DIFFERENTIAL/PLATELET
Basophils Absolute: 0 10*3/uL (ref 0.0–0.2)
Basos: 1 %
EOS (ABSOLUTE): 0.1 10*3/uL (ref 0.0–0.4)
Eos: 2 %
Hematocrit: 40.9 % (ref 34.0–46.6)
Hemoglobin: 13.2 g/dL (ref 11.1–15.9)
Immature Grans (Abs): 0 10*3/uL (ref 0.0–0.1)
Immature Granulocytes: 0 %
Lymphocytes Absolute: 1.6 10*3/uL (ref 0.7–3.1)
Lymphs: 26 %
MCH: 28.5 pg (ref 26.6–33.0)
MCHC: 32.3 g/dL (ref 31.5–35.7)
MCV: 88 fL (ref 79–97)
Monocytes Absolute: 0.4 10*3/uL (ref 0.1–0.9)
Monocytes: 6 %
Neutrophils Absolute: 3.9 10*3/uL (ref 1.4–7.0)
Neutrophils: 65 %
Platelets: 376 10*3/uL (ref 150–450)
RBC: 4.63 x10E6/uL (ref 3.77–5.28)
RDW: 13.1 % (ref 11.7–15.4)
WBC: 6 10*3/uL (ref 3.4–10.8)

## 2022-12-09 LAB — HEMOGLOBIN A1C
Est. average glucose Bld gHb Est-mCnc: 114 mg/dL
Hgb A1c MFr Bld: 5.6 % (ref 4.8–5.6)

## 2022-12-09 LAB — CMP14+EGFR
ALT: 28 IU/L (ref 0–32)
AST: 17 IU/L (ref 0–40)
Albumin/Globulin Ratio: 1.8 (ref 1.2–2.2)
Albumin: 4.6 g/dL (ref 3.9–4.9)
Alkaline Phosphatase: 51 IU/L (ref 44–121)
BUN/Creatinine Ratio: 11 (ref 9–23)
BUN: 8 mg/dL (ref 6–20)
Bilirubin Total: 0.8 mg/dL (ref 0.0–1.2)
CO2: 22 mmol/L (ref 20–29)
Calcium: 9.2 mg/dL (ref 8.7–10.2)
Chloride: 101 mmol/L (ref 96–106)
Creatinine, Ser: 0.72 mg/dL (ref 0.57–1.00)
Globulin, Total: 2.6 g/dL (ref 1.5–4.5)
Glucose: 83 mg/dL (ref 70–99)
Potassium: 4.5 mmol/L (ref 3.5–5.2)
Sodium: 139 mmol/L (ref 134–144)
Total Protein: 7.2 g/dL (ref 6.0–8.5)
eGFR: 115 mL/min/{1.73_m2} (ref >59)

## 2023-01-08 ENCOUNTER — Ambulatory Visit (INDEPENDENT_AMBULATORY_CARE_PROVIDER_SITE_OTHER): Payer: Medicaid Other | Admitting: Primary Care

## 2023-01-27 ENCOUNTER — Other Ambulatory Visit: Payer: Self-pay

## 2023-01-27 ENCOUNTER — Ambulatory Visit: Payer: Medicaid Other | Admitting: Certified Nurse Midwife

## 2023-02-14 NOTE — Progress Notes (Signed)
Pt did not come to appt

## 2023-03-02 ENCOUNTER — Ambulatory Visit (INDEPENDENT_AMBULATORY_CARE_PROVIDER_SITE_OTHER): Payer: Self-pay | Admitting: *Deleted

## 2023-03-02 NOTE — Telephone Encounter (Signed)
Pt sent a a message requesting appt   Replied back to this pt this afternoon   "Good Afternoon  Meagan Skinner doesn't have anything available this week. You will need to contact OBGYN to see if they can get you in sooner. If not please be seen at the [facility] hospital at [facility] "

## 2023-03-02 NOTE — Telephone Encounter (Addendum)
  Chief Complaint: prolonged bleeding on Depo Provera(started January) Symptoms: prolonged off/on bleeding- heavy to light- may start and stop- abdominal cramping- mild, weak, tired Frequency: started 02/06/23 Pertinent Negatives: Patient denies severe cramping Disposition: [] ED /[] Urgent Care (no appt availability in office) / [] Appointment(In office/virtual)/ []  [place] [facility]/ [] Home Care/ [] Refused Recommended Disposition /[] [place] [facility]/ [x]  Follow-up with PCP Additional Notes: Patient had appointment for next Depo 5/15- but is requesting appointment to address her bleeding- she can come to office today or Thursday- Patient advised will send request for review    Reason for Disposition  Periods last > 7 days  Answer Assessment - Initial Assessment Questions 1. AMOUNT: "Describe the bleeding that you are having."    - SPOTTING: spotting, or pinkish / brownish mucous discharge; does not fill panty liner or pad    - MILD:  less than 1 pad / hour; less than patient's usual menstrual bleeding   - MODERATE: 1-2 pads / hour; 1 menstrual cup every 6 hours; small-medium blood clots (e.g., pea, grape, small coin)   - SEVERE: soaking 2 or more pads/hour for 2 or more hours; 1 menstrual cup every 2 hours; bleeding not contained by pads or continuous red blood from vagina; large blood clots (e.g., golf ball, large coin)      Bright red to dark, heavy some day and then light 2. ONSET: "When did the bleeding begin?" "Is it continuing now?"     3 weeks-4/13 3. MENSTRUAL PERIOD: "When was the last normal menstrual period?" "How is this different than your period?"     Patient is in Depo- she restarted the shot January 4. REGULARITY: "How regular are your periods?"     On Depo 5. ABDOMEN PAIN: "Do you have any pain?" "How bad is the pain?"  (e.g., Scale 1-10; mild, moderate, or severe)   - MILD (1-3): doesn't interfere with normal activities, abdomen soft and not tender to touch    -  MODERATE (4-7): interferes with normal activities or awakens from sleep, abdomen tender to touch    - SEVERE (8-10): excruciating pain, doubled over, unable to do any normal activities      mild 6. PREGNANCY: "Is there any chance you are pregnant?" "When was your last menstrual period?"     no 7. BREASTFEEDING: "Are you breastfeeding?"     no 8. HORMONE MEDICINES: "Are you taking any hormone medicines, prescription or over-the-counter?" (e.g., birth control pills, estrogen)     Depo  9. BLOOD THINNER MEDICINES: "Do you take any blood thinners?" (e.g., Coumadin / warfarin, Pradaxa / dabigatran, aspirin)     no 10. CAUSE: "What do you think is causing the bleeding?" (e.g., recent gyn surgery, recent gyn procedure; known bleeding disorder, cervical cancer, polycystic ovarian disease, fibroids)         Depo Provera 11. HEMODYNAMIC STATUS: "Are you weak or feeling lightheaded?" If Yes, ask: "Can you stand and walk normally?"        Weakness, no energy, tired  Protocols used: Vaginal Bleeding - Abnormal-A-AH

## 2023-03-10 ENCOUNTER — Ambulatory Visit (INDEPENDENT_AMBULATORY_CARE_PROVIDER_SITE_OTHER): Payer: Medicaid Other

## 2023-03-10 DIAGNOSIS — Z30013 Encounter for initial prescription of injectable contraceptive: Secondary | ICD-10-CM | POA: Diagnosis not present

## 2023-03-10 MED ORDER — MEDROXYPROGESTERONE ACETATE 150 MG/ML IM SUSY
150.0000 mg | PREFILLED_SYRINGE | Freq: Once | INTRAMUSCULAR | Status: AC
  Administered 2023-03-10: 150 mg via INTRAMUSCULAR

## 2023-03-10 NOTE — Progress Notes (Signed)
Pt came into the office today for depo injection Pt received last depo 12/15/2022 Pt is on time for depo  No HCG needed Depo given in right glute Pt is due for next depo July 31-Aug 14     

## 2023-05-26 ENCOUNTER — Ambulatory Visit (INDEPENDENT_AMBULATORY_CARE_PROVIDER_SITE_OTHER): Payer: Medicaid Other

## 2023-05-26 DIAGNOSIS — Z30013 Encounter for initial prescription of injectable contraceptive: Secondary | ICD-10-CM | POA: Diagnosis not present

## 2023-05-26 MED ORDER — MEDROXYPROGESTERONE ACETATE 150 MG/ML IM SUSY
150.0000 mg | PREFILLED_SYRINGE | Freq: Once | INTRAMUSCULAR | Status: AC
  Administered 2023-05-26: 150 mg via INTRAMUSCULAR

## 2023-05-26 NOTE — Progress Notes (Signed)
Pt came into the office today for depo injection Pt is on time for depo  No HCG needed  Depo given in right glute Pt is due for next depo Oct 16-Oct 30

## 2023-07-05 ENCOUNTER — Other Ambulatory Visit: Payer: Self-pay

## 2023-07-17 ENCOUNTER — Other Ambulatory Visit: Payer: Self-pay

## 2023-08-24 ENCOUNTER — Ambulatory Visit (INDEPENDENT_AMBULATORY_CARE_PROVIDER_SITE_OTHER): Payer: Medicaid Other

## 2023-08-25 ENCOUNTER — Ambulatory Visit (INDEPENDENT_AMBULATORY_CARE_PROVIDER_SITE_OTHER): Payer: Medicaid Other

## 2023-08-26 ENCOUNTER — Encounter (INDEPENDENT_AMBULATORY_CARE_PROVIDER_SITE_OTHER): Payer: Self-pay | Admitting: Primary Care

## 2023-08-26 ENCOUNTER — Telehealth (INDEPENDENT_AMBULATORY_CARE_PROVIDER_SITE_OTHER): Payer: Medicaid Other | Admitting: Primary Care

## 2023-08-26 ENCOUNTER — Ambulatory Visit (INDEPENDENT_AMBULATORY_CARE_PROVIDER_SITE_OTHER): Payer: Medicaid Other

## 2023-08-26 DIAGNOSIS — Z30013 Encounter for initial prescription of injectable contraceptive: Secondary | ICD-10-CM

## 2023-08-26 DIAGNOSIS — Z23 Encounter for immunization: Secondary | ICD-10-CM

## 2023-08-26 DIAGNOSIS — K05219 Aggressive periodontitis, localized, unspecified severity: Secondary | ICD-10-CM | POA: Diagnosis not present

## 2023-08-26 MED ORDER — MEDROXYPROGESTERONE ACETATE 150 MG/ML IM SUSY
150.0000 mg | PREFILLED_SYRINGE | Freq: Once | INTRAMUSCULAR | Status: AC
  Administered 2023-08-26: 150 mg via INTRAMUSCULAR

## 2023-08-26 MED ORDER — IBUPROFEN 600 MG PO TABS: 600.0000 mg | ORAL_TABLET | Freq: Three times a day (TID) | ORAL | 1 refills | Status: AC | PRN

## 2023-08-26 MED ORDER — AMOXICILLIN-POT CLAVULANATE 875-125 MG PO TABS: 1.0000 | ORAL_TABLET | Freq: Two times a day (BID) | ORAL | 0 refills | Status: DC

## 2023-08-26 MED ORDER — FLUCONAZOLE 150 MG PO TABS: 150.0000 mg | ORAL_TABLET | Freq: Every day | ORAL | 1 refills | Status: DC

## 2023-08-26 NOTE — Progress Notes (Signed)
Pt came into the office for her depo Pt was due for depo Oct 16-Oct 30  Pt had an appt 08/25/2023 for depo but had to cancel because office was closed Since pt is only a day late provider gave okay to give depo No HCG needed Depo given in right glute Next depo is due between Jan 16-Jan 30

## 2023-08-26 NOTE — Patient Instructions (Signed)
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
## 2023-11-24 ENCOUNTER — Ambulatory Visit (INDEPENDENT_AMBULATORY_CARE_PROVIDER_SITE_OTHER): Payer: Medicaid Other

## 2023-11-24 DIAGNOSIS — Z30013 Encounter for initial prescription of injectable contraceptive: Secondary | ICD-10-CM

## 2023-11-24 MED ORDER — MEDROXYPROGESTERONE ACETATE 150 MG/ML IM SUSP
150.0000 mg | Freq: Once | INTRAMUSCULAR | Status: AC
  Administered 2023-11-24: 150 mg via INTRAMUSCULAR

## 2023-11-24 NOTE — Progress Notes (Signed)
Pt came into the office for her depo No HCG needed Depo given in left glute Next depo is due between April 16-April 30

## 2024-01-04 ENCOUNTER — Ambulatory Visit (INDEPENDENT_AMBULATORY_CARE_PROVIDER_SITE_OTHER): Admitting: Radiology

## 2024-01-04 ENCOUNTER — Encounter: Payer: Self-pay | Admitting: Emergency Medicine

## 2024-01-04 ENCOUNTER — Ambulatory Visit
Admission: EM | Admit: 2024-01-04 | Discharge: 2024-01-04 | Disposition: A | Discharge status: Home / Self Care | Payer: Self-pay | Attending: Internal Medicine | Admitting: Internal Medicine

## 2024-01-04 DIAGNOSIS — J209 Acute bronchitis, unspecified: Secondary | ICD-10-CM

## 2024-01-04 DIAGNOSIS — R0602 Shortness of breath: Secondary | ICD-10-CM

## 2024-01-04 MED ORDER — AEROCHAMBER PLUS FLO-VU MEDIUM MISC
1.0000 | Freq: Once | Status: AC
  Administered 2024-01-04: 1

## 2024-01-04 MED ORDER — DEXAMETHASONE SODIUM PHOSPHATE 10 MG/ML IJ SOLN
10.0000 mg | Freq: Once | INTRAMUSCULAR | Status: AC
  Administered 2024-01-04: 10 mg via INTRAMUSCULAR

## 2024-01-04 MED ORDER — PREDNISONE 20 MG PO TABS: 40.0000 mg | ORAL_TABLET | Freq: Every day | ORAL | 0 refills | Status: AC

## 2024-01-04 MED ORDER — ALBUTEROL SULFATE (2.5 MG/3ML) 0.083% IN NEBU
2.5000 mg | INHALATION_SOLUTION | Freq: Once | RESPIRATORY_TRACT | Status: AC
Start: 2024-01-04 — End: 2024-01-04
  Administered 2024-01-04: 2.5 mg via RESPIRATORY_TRACT

## 2024-01-04 MED ORDER — ALBUTEROL SULFATE HFA 108 (90 BASE) MCG/ACT IN AERS
2.0000 | INHALATION_SPRAY | Freq: Once | RESPIRATORY_TRACT | Status: AC
  Administered 2024-01-04: 2 via RESPIRATORY_TRACT

## 2024-01-04 MED ORDER — PROMETHAZINE-DM 6.25-15 MG/5ML PO SYRP
5.0000 mL | ORAL_SOLUTION | Freq: Every evening | ORAL | 0 refills | Status: AC | PRN
Start: 2024-01-04 — End: ?

## 2024-01-04 NOTE — ED Provider Notes (Signed)
 [facility]    CSN: [id number] Arrival date & time: 01/04/24  1256      History   Chief Complaint Chief Complaint  Patient presents with   Cough    HPI Meagan Skinner is a 33 y.o. female.   Meagan Skinner is a 33 y.o. female presenting for chief complaint of Cough that started 2 weeks ago. Symptoms started with nasal congestion, cough, body aches, etc. Other symptoms have resolved, cough has persisted. She reports bilateral chest discomfort and intermittent shortness of breath associated with coughing fits. Never smoker, denies history of asthma, COPD, other chronic respiratory problems. Denies vape use. No leg swelling, orthopnea, recent fevers/chills, nausea, vomiting, abdominal pain, rash, and heart palpitations.  Denies recent antibiotic/steroid use in the last 90 days. She has been taking OTC medications without much relief.    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   Urticaria     Patient Active Problem List   Diagnosis Date Noted   Angioedema 10/29/2020   Urticaria 09/26/2020   Normal labor 10/09/2016   Premature cervical dilation in third trimester 08/31/2016    Past Surgical History:  Procedure Laterality Date   MULTIPLE TOOTH EXTRACTIONS      OB History     Gravida  2   Para  1   Term  1   Preterm      AB  1   Living  1      SAB  0   IAB  1   Ectopic      Multiple  0   Live Births  1            Home Medications    Prior to Admission medications   Medication Sig Start Date End Date Taking? Authorizing Provider  predniSONE (DELTASONE) 20 MG tablet Take 2 tablets (40 mg total) by mouth daily with breakfast for 5 days. 01/04/24 01/09/24 Yes Carlisle Beers, FNP   promethazine-dextromethorphan (PROMETHAZINE-DM) 6.25-15 MG/5ML syrup Take 5 mLs by mouth at bedtime as needed for cough. 01/04/24  Yes Carlisle Beers, FNP  amoxicillin-clavulanate (AUGMENTIN) 875-125 MG tablet Take 1 tablet by mouth 2 (two) times daily. Patient not taking: Reported on 01/04/2024 08/26/23   Grayce Sessions, NP  fluconazole (DIFLUCAN) 150 MG tablet Take 1 tablet (150 mg total) by mouth daily. Patient not taking: Reported on 01/04/2024 08/26/23   Grayce Sessions, NP  ibuprofen (ADVIL) 600 MG tablet Take 1 tablet (600 mg total) by mouth every 8 (eight) hours as needed. 08/26/23   Grayce Sessions, NP  metroNIDAZOLE (METROGEL VAGINAL) 0.75 % vaginal gel Place 1 Applicatorful vaginally at bedtime. Patient not taking: Reported on 12/08/2022 10/19/21   Bernerd Limbo, CNM  omalizumab Geoffry Paradise) 150 MG/ML prefilled syringe INJECT 300 MG INTO THE SKIN EVERY 28 (TWENTY-EIGHT) DAYS. Patient not taking: Reported on 03/26/2021 01/22/21 01/22/22  Marcelyn Bruins, MD    Family History Family History  Problem Relation Age of Onset   Diabetes Maternal Grandmother    Crohn's disease Maternal Grandmother    Fibromyalgia Maternal Grandmother    Heart disease Maternal Grandmother    Asthma Maternal Grandmother  Stroke Maternal Grandmother    Hypertension Maternal Grandmother    Rheum arthritis Maternal Grandmother    Cancer Maternal Grandfather    Asthma Sister    Heart disease Sister    Asthma Brother    Healthy Son     Social History Social History   Tobacco Use   Smoking status: Never   Smokeless tobacco: Never  Vaping Use   Vaping status: Never Used  Substance Use Topics   Alcohol use: Yes    Alcohol/week: 2.0 standard drinks of alcohol    Types: 2 Glasses of wine per week    Comment: socially   Drug use: No     Allergies   Patient has no known allergies.   Review of Systems Review of Systems  Respiratory:  Positive for cough.   Per  HPI   Physical Exam Triage Vital Signs ED Triage Vitals  Encounter Vitals Group     BP --      Systolic BP Percentile --      Diastolic BP Percentile --      Pulse Rate 01/04/24 1333 99     Resp 01/04/24 1333 20     Temp 01/04/24 1333 98.9 F (37.2 C)     Temp Source 01/04/24 1333 Oral     SpO2 01/04/24 1333 93 %     Weight --      Height --      Head Circumference --      Peak Flow --      Pain Score 01/04/24 1337 0     Pain Loc --      Pain Education --      Exclude from Growth Chart --    No data found.  Updated Vital Signs Pulse 99   Temp 98.9 F (37.2 C) (Oral)   Resp 20   LMP 11/26/2023 (Exact Date)   SpO2 93%   Visual Acuity Right Eye Distance:   Left Eye Distance:   Bilateral Distance:    Right Eye Near:   Left Eye Near:    Bilateral Near:     Physical Exam Vitals and nursing note reviewed.  Constitutional:      Appearance: She is not ill-appearing or toxic-appearing.  HENT:     Head: Normocephalic and atraumatic.     Right Ear: Hearing, tympanic membrane, ear canal and external ear normal.     Left Ear: Hearing, tympanic membrane, ear canal and external ear normal.     Nose: Nose normal.     Mouth/Throat:     Lips: Pink.     Mouth: Mucous membranes are moist. No injury or oral lesions.     Dentition: Normal dentition.     Tongue: No lesions.     Pharynx: Oropharynx is clear. Uvula midline. No pharyngeal swelling, oropharyngeal exudate, posterior oropharyngeal erythema, uvula swelling or postnasal drip.     Tonsils: No tonsillar exudate.  Eyes:     General: Lids are normal. Vision grossly intact. Gaze aligned appropriately.     Extraocular Movements: Extraocular movements intact.     Conjunctiva/sclera: Conjunctivae normal.  Neck:     Trachea: Trachea and phonation normal.  Cardiovascular:     Rate and Rhythm: Normal rate and regular rhythm.     Heart sounds: Normal heart sounds, S1 normal and S2 normal.  Pulmonary:     Effort: Pulmonary  effort is normal. No respiratory distress.     Breath sounds: Normal air entry. Wheezing present. No rhonchi or  rales.     Comments: Frequent dry and harsh cough on exam. Speaks in full sentences without increased respiratory effort. Course breath sounds throughout on initial assessment.  Chest:     Chest wall: No tenderness.  Musculoskeletal:     Cervical back: Neck supple.  Lymphadenopathy:     Cervical: No cervical adenopathy.  Skin:    General: Skin is warm and dry.     Capillary Refill: Capillary refill takes less than 2 seconds.     Findings: No rash.  Neurological:     General: No focal deficit present.     Mental Status: She is alert and oriented to person, place, and time. Mental status is at baseline.     Cranial Nerves: No dysarthria or facial asymmetry.  Psychiatric:        Mood and Affect: Mood normal.        Speech: Speech normal.        Behavior: Behavior normal.        Thought Content: Thought content normal.        Judgment: Judgment normal.      UC Treatments / Results  Labs (all labs ordered are listed, but only abnormal results are displayed) Labs Reviewed - No data to display  EKG   Radiology No results found.  Procedures Procedures (including critical care time)  Medications Ordered in UC Medications  albuterol (VENTOLIN HFA) 108 (90 Base) MCG/ACT inhaler 2 puff (2 puffs Inhalation Given 01/04/24 1343)  AeroChamber Plus Flo-Vu Medium MISC 1 each (1 each Other Given 01/04/24 1343)  albuterol (PROVENTIL) (2.5 MG/3ML) 0.083% nebulizer solution 2.5 mg (2.5 mg Nebulization Given 01/04/24 1416)  dexamethasone (DECADRON) injection 10 mg (10 mg Intramuscular Given 01/04/24 1421)    Initial Impression / Assessment and Plan / UC Course  I have reviewed the triage vital signs and the nursing notes.  Pertinent labs & imaging results that were available during my care of the patient were reviewed by me and considered in my medical decision making (see chart  for details).   1. Acute bronchitis, shortness of breath Evaluation suggests viral bronchitis, though given patient's history and risk for pneumonia, chest x-ray ordered. Chest x-ray negative for acute cardiopulmonary abnormality by my interpretation, staff will call if radiology re-read shows abnormality requiring change in treatment plan.   Albuterol breathing treatment and dexamethasone 10mg  IM given in clinic with significant improvement in lung sounds and shortness of breath on reassessment.   Recommend treatment with steroid, bronchodilator, cough suppressants for symptomatic relief, and expectorants (mucinex) as needed- see AVS.     Counseled patient on potential for adverse effects with medications prescribed/recommended today, strict ER and return-to-clinic precautions discussed, patient verbalized understanding.    Final Clinical Impressions(s) / UC Diagnoses   Final diagnoses:  Acute bronchitis, unspecified organism  Shortness of breath     Discharge Instructions      You have bronchitis which is inflammation of the upper airways in your lungs due to a virus.   We will treat this with steroids. Do not take any NSAIDs with steroid pills (no ibuprofen, naproxen while taking steroid, this could cause stomach upset).   Use albuterol every 4-6 hours as needed for cough, shortness of breath, and wheezing.   Use guaifenesin (plain mucinex) to break up congestion in nose/chest so that you are able to excrete easier. Drink plenty of fluids to stay well hydrated while taking mucinex so that it works well in the body.   If  you develop any new or worsening symptoms or if your symptoms do not start to improve, please return here or follow-up with your primary care provider. If your symptoms are severe, please go to the emergency room.     ED Prescriptions     Medication Sig Dispense Auth. Provider   predniSONE (DELTASONE) 20 MG tablet Take 2 tablets (40 mg total) by mouth daily  with breakfast for 5 days. 10 tablet Carlisle Beers, FNP   promethazine-dextromethorphan (PROMETHAZINE-DM) 6.25-15 MG/5ML syrup Take 5 mLs by mouth at bedtime as needed for cough. 118 mL Carlisle Beers, FNP      PDMP not reviewed this encounter.   Carlisle Beers, [place] 01/04/24 1435

## 2024-01-04 NOTE — ED Triage Notes (Signed)
 Pt c/o cough wont go away. She was sick about 2 weeks ago and cough has not went away

## 2024-01-04 NOTE — Discharge Instructions (Addendum)
 You have bronchitis which is inflammation of the upper airways in your lungs due to a virus.   We will treat this with steroids. Do not take any NSAIDs with steroid pills (no ibuprofen, naproxen while taking steroid, this could cause stomach upset).   Use albuterol every 4-6 hours as needed for cough, shortness of breath, and wheezing.   Use guaifenesin (plain mucinex) to break up congestion in nose/chest so that you are able to excrete easier. Drink plenty of fluids to stay well hydrated while taking mucinex so that it works well in the body.   If you develop any new or worsening symptoms or if your symptoms do not start to improve, please return here or follow-up with your primary care provider. If your symptoms are severe, please go to the emergency room.

## 2024-02-09 ENCOUNTER — Ambulatory Visit (INDEPENDENT_AMBULATORY_CARE_PROVIDER_SITE_OTHER): Payer: Medicaid Other

## 2024-02-10 ENCOUNTER — Ambulatory Visit (INDEPENDENT_AMBULATORY_CARE_PROVIDER_SITE_OTHER): Payer: Self-pay

## 2024-02-10 DIAGNOSIS — Z30013 Encounter for initial prescription of injectable contraceptive: Secondary | ICD-10-CM

## 2024-02-14 MED ORDER — MEDROXYPROGESTERONE ACETATE 150 MG/ML IM SUSP: 150.0000 mg | Freq: Once | INTRAMUSCULAR | Status: AC

## 2024-02-14 NOTE — Progress Notes (Signed)
 Pt came into the office for her depo No HCG needed Depo given in right glute Next depo is due between July 3-July 17

## 2024-02-15 ENCOUNTER — Encounter (INDEPENDENT_AMBULATORY_CARE_PROVIDER_SITE_OTHER): Payer: Self-pay | Admitting: Primary Care

## 2024-02-15 ENCOUNTER — Ambulatory Visit (INDEPENDENT_AMBULATORY_CARE_PROVIDER_SITE_OTHER): Payer: Self-pay | Admitting: Primary Care

## 2024-02-15 VITALS — BP 135/80 | HR 86 | Resp 16 | Wt 176.2 lb

## 2024-02-15 DIAGNOSIS — N939 Abnormal uterine and vaginal bleeding, unspecified: Secondary | ICD-10-CM

## 2024-02-15 NOTE — Patient Instructions (Signed)
 Abnormal Uterine Bleeding    Abnormal uterine bleeding means bleeding more than normal from your womb (uterus). It can include:  Bleeding after sex.  Bleeding between monthly (menstrual) periods.  Bleeding that is heavier than normal.  Monthly periods that last longer than normal.  Bleeding after you have stopped having your monthly period (menopause).  You should see a doctor for any kind of bleeding that is not normal. Treatment depends on the cause of your bleeding and how much you bleed.  Follow these instructions at home:  Medicines  Take over-the-counter and prescription medicines only as told by your doctor.  Ask your doctor about:  Taking medicines such as aspirin and ibuprofen. Do not take these medicines unless your doctor tells you to take them.  Taking over-the-counter medicines, vitamins, herbs, and supplements.  You may be given iron pills. Take them as told by your doctor.  Managing constipation  If you take iron pills, you may need to take these actions to prevent or treat trouble pooping (constipation):  Drink enough fluid to keep your pee (urine) pale yellow.  Take over-the-counter or prescription medicines.  Eat foods that are high in fiber. These include beans, whole grains, and fresh fruits and vegetables.  Limit foods that are high in fat and sugar. These include fried or sweet foods.  Activity  Change your activity to decrease bleeding if you need to change your sanitary pad more than one time every 2 hours:  Lie in bed with your feet raised (elevated).  Place a cold pack on your lower belly.  Rest as much as you are able until the bleeding stops or slows down.  General instructions  Do not use tampons, douche, or have sex until your doctor says these things are okay.  Change your pads often.  Get regular exams. These include:  Pelvic exams.  Screenings for cancer of the cervix.  It is up to you to get the results of any tests that are done. Ask how to get your results when they are  ready.  Watch for any changes in your bleeding. For 2 months, write down:  When your monthly period starts.  When your monthly period ends.  When you get any abnormal bleeding from your vagina.  What problems you notice.  Keep all follow-up visits.  Contact a doctor if:  The bleeding lasts more than one week.  You feel dizzy at times.  You feel like you may vomit (nausea).  You vomit.  You feel light-headed or weak.  Your symptoms get worse.  Get help right away if:  You faint.  You have to change pads every hour.  You have pain in your belly.  You have a fever or chills.  You get sweaty or weak.  You pass large blood clots from your vagina.  These symptoms may be an emergency. Get help right away. Call your local emergency services (911 in the [place]  Do not wait to see if the symptoms will go away.  Do not drive yourself to the hospital.  Summary  Abnormal uterine bleeding means bleeding more than normal from your womb (uterus).  Any kind of bleeding that is not normal should be checked by a doctor.  Treatment depends on the cause of your bleeding and how much you bleed.  Get help right away if you faint, you have to change pads every hour, or you pass large blood clots from your vagina.  This information is not intended to replace  advice given to you by your health care provider. Make sure you discuss any questions you have with your health care provider.  Document Revised: 02/11/2021 Document Reviewed: 02/11/2021  Elsevier Patient Education  2024 [employer].

## 2024-02-15 NOTE — Progress Notes (Signed)
 [facility]  Meagan Skinner, is a 33 y.o. female  GNF:[id number]  QIO:[id number]  DOB - [birth year]  Chief Complaint  Patient presents with   Vaginal Bleeding    Started 2 weeks ago Started bright red now brown         Subjective:   Meagan Skinner is a 33 y.o. female here today for an acute visit.  Vaginal Bleeding The patient's primary symptoms include vaginal bleeding. This is a new problem. The current episode started 1 to 4 weeks ago. The problem occurs intermittently. The problem has been gradually improving. The patient is experiencing no pain. She is not pregnant. The vaginal discharge was scant, bloody and brown. The vaginal bleeding is spotting. She has been passing clots. She has not been passing tissue. She has tried nothing for the symptoms. She is sexually active. No, her partner does not have an STD. She uses progestin injections for contraception. Menstrual history: absent  but 2 cycles in a years.    No problems updated.  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   Urticaria     Current Outpatient Medications on File Prior to Visit  Medication Sig Dispense Refill   ibuprofen  (ADVIL ) 600 MG tablet Take 1 tablet (600 mg total) by mouth every 8 (eight) hours as needed. 90 tablet 1   promethazine -dextromethorphan (PROMETHAZINE -DM) 6.25-15 MG/5ML syrup Take 5 mLs by mouth at bedtime as needed for cough. 118 mL 0   Current Facility-Administered Medications on File Prior to Visit  Medication Dose Route Frequency Provider Last Rate Last Admin   medroxyPROGESTERone  (DEPO-PROVERA ) injection 150 mg  150 mg Intramuscular Once  Marius Siemens, NP       Health Maintenance  Topic Date Due   DTaP/Tdap/Td vaccine (1 - Tdap) Never done   COVID-19 Vaccine (3 - 2024-25 season) 06/27/2023   Pap with HPV screening  07/25/2023   Flu Shot  05/26/2024   Hepatitis C Screening  Completed   HIV Screening  Completed   HPV Vaccine  Aged Out   Meningitis B Vaccine  Aged Out    Objective:   Vitals:   02/15/24 1115  BP: 135/80  Pulse: 86  Resp: 16  SpO2: 99%  Weight: 176 lb 3.2 oz (79.9 kg)    Physical Exam Vitals reviewed.  Constitutional:      Appearance: Normal appearance. She is obese.  HENT:     Head: Normocephalic.     Right Ear: Tympanic membrane, ear canal and external ear normal.     Left Ear: Tympanic membrane, ear canal and external ear normal.     Nose: Nose normal.     Mouth/Throat:     Mouth: Mucous membranes are moist.  Eyes:     Extraocular Movements: Extraocular movements intact.     Pupils: Pupils are equal, round, and reactive to light.  Cardiovascular:     Rate and Rhythm: Normal rate.  Pulmonary:     Effort: Pulmonary effort is normal.     Breath sounds: Normal breath sounds.  Abdominal:     General: Bowel sounds are normal.  Palpations: Abdomen is soft.  Musculoskeletal:        General: Normal range of motion.     Cervical back: Normal range of motion.  Skin:    General: Skin is warm and dry.  Neurological:     Mental Status: She is alert and oriented to person, place, and time.  Psychiatric:        Mood and Affect: Mood normal.        Behavior: Behavior normal.        Thought Content: Thought content normal.     Assessment & Plan  [name] was seen today for vaginal bleeding.  Diagnoses and all orders for this visit:  Abnormal uterine bleeding (AUB) See HPI     Patient have been counseled extensively about nutrition and exercise. Other issues discussed during this visit include: low cholesterol diet, weight control and daily exercise, foot care, annual eye  examinations at Ophthalmology, importance of adherence with medications and regular follow-up. We also discussed long term complications of uncontrolled diabetes and hypertension.   Return in about 3 months (around 05/16/2024) for re-check blood pressure.  The patient was given clear instructions to go to ER or return to medical center if symptoms don't improve, worsen or new problems develop. The patient verbalized understanding. The patient was told to call to get lab results if they haven't heard anything in the next week.   This note has been created with Education officer, environmental. Any transcriptional errors are unintentional.   Marius Siemens, NP 02/15/2024, 2:16 PM

## 2024-05-30 ENCOUNTER — Encounter: Payer: Self-pay | Admitting: Family Medicine

## 2024-05-30 ENCOUNTER — Ambulatory Visit (INDEPENDENT_AMBULATORY_CARE_PROVIDER_SITE_OTHER): Admitting: Family Medicine

## 2024-05-30 ENCOUNTER — Other Ambulatory Visit (HOSPITAL_COMMUNITY)
Admission: RE | Admit: 2024-05-30 | Discharge: 2024-05-30 | Disposition: A | Discharge status: Home / Self Care | Source: Ambulatory Visit | Attending: Family Medicine | Admitting: Family Medicine

## 2024-05-30 ENCOUNTER — Ambulatory Visit: Payer: Self-pay

## 2024-05-30 VITALS — BP 128/89 | HR 83 | Ht 65.0 in | Wt 188.6 lb

## 2024-05-30 DIAGNOSIS — N898 Other specified noninflammatory disorders of vagina: Secondary | ICD-10-CM

## 2024-05-30 DIAGNOSIS — Z113 Encounter for screening for infections with a predominantly sexual mode of transmission: Secondary | ICD-10-CM | POA: Insufficient documentation

## 2024-05-30 NOTE — Telephone Encounter (Signed)
 FYI Only or Action Required?: FYI only for provider.  Patient was last seen in primary care on 02/15/2024 by Meagan Rosaline SQUIBB, NP.  Called Nurse Triage reporting Vaginal Discharge.  Symptoms began several weeks ago.  Interventions attempted: Nothing.  Symptoms are: gradually worsening.  Triage Disposition: See PCP When Office is Open (Within 3 Days)  Patient/caregiver understands and will follow disposition?: Yes Summary: possible yeast infection or BV   Patient states she may have possible yeast infection or BV. Has following symptoms: discharge, itching & a slight odor  Requesting appointment, [phone]         Reason for Disposition  Bad smelling vaginal discharge  Answer Assessment - Initial Assessment Questions 1. DISCHARGE: Describe the discharge. (e.g., white, yellow, green, gray, foamy, cottage cheese-like)     White, yellow tint discharge  2. ODOR: Is there a bad odor?     Slight color  3. ONSET: When did the discharge begin?     2-3 weeks  4. RASH: Is there a rash in the genital area? If Yes, ask: Describe it. (e.g., redness, blisters, sores, bumps)     No  5. ABDOMEN PAIN: Are you having any abdomen pain? If Yes, ask: What does it feel like?  (e.g., crampy, dull, intermittent, constant)      No  6. ABDOMEN PAIN SEVERITY: If present, ask: How bad is it? (e.g., Scale 1-10; mild, moderate, or severe)     No  7. CAUSE: What do you think is causing the discharge? Have you had the same problem before? What happened then?     Possible BV or yeast infection  8. OTHER SYMPTOMS: Do you have any other symptoms? (e.g., fever, itching, urination pain, vaginal bleeding, vaginal foreign body)     No  9. PREGNANCY: Is there any chance you are pregnant? When was your last menstrual period?     Unsure, miss depo shot on 7/17  Protocols used: Vaginal Discharge-A-AH

## 2024-05-30 NOTE — Telephone Encounter (Signed)
 Pt was seen at [facility] today 05/30/24

## 2024-05-30 NOTE — Progress Notes (Signed)
 Established Patient Office Visit  Subjective    Patient ID: Meagan Skinner, female    DOB: August 13, 1991  Age: 33 y.o. MRN: [id number]  CC:  Chief Complaint  Patient presents with   Vaginal Discharge    HPI Meagan Skinner presents with complaint of vaginal discharge and desiring std screen.   Outpatient Encounter Medications as of 05/30/2024  Medication Sig   ibuprofen  (ADVIL ) 600 MG tablet Take 1 tablet (600 mg total) by mouth every 8 (eight) hours as needed.   promethazine -dextromethorphan (PROMETHAZINE -DM) 6.25-15 MG/5ML syrup Take 5 mLs by mouth at bedtime as needed for cough. 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   Urticaria     Past Surgical History:  Procedure Laterality Date   MULTIPLE TOOTH EXTRACTIONS      Family History  Problem Relation Age of Onset   Diabetes Maternal Grandmother    Crohn's disease Maternal Grandmother    Fibromyalgia Maternal Grandmother    Heart disease Maternal Grandmother    Asthma Maternal Grandmother    Stroke Maternal Grandmother    Hypertension Maternal Grandmother    Rheum arthritis Maternal Grandmother    Cancer Maternal Grandfather    Asthma Sister    Heart disease Sister    Asthma Brother    Healthy Son     Social History   Socioeconomic History   Marital status: Single    Spouse name: Not on file   Number of children: Not on file   Years of education: Not on file   Highest education level: Not on file  Occupational History   Not on file  Tobacco Use   Smoking status: Never   Smokeless tobacco: Never   Vaping Use   Vaping status: Never Used  Substance and Sexual Activity   Alcohol use: Yes    Alcohol/week: 2.0 standard drinks of alcohol    Types: 2 Glasses of wine per week    Comment: socially   Drug use: No   Sexual activity: Not Currently    Birth control/protection: Condom  Other Topics Concern   Not on file  Social History Narrative   Not on file   Social Drivers of Health   Financial Resource Strain: Low Risk  (09/03/2021)   Overall Financial Resource Strain (CARDIA)    Difficulty of Paying Living Expenses: Not hard at all  Food Insecurity: No Food Insecurity (02/15/2024)   Hunger Vital Sign    Worried About Running Out of Food in the Last Year: Never true    Ran Out of Food in the Last Year: Never true  Transportation Needs: No Transportation Needs (02/15/2024)   PRAPARE - Administrator, Civil Service (Medical): No    Lack of Transportation (Non-Medical): No  Physical Activity: Inactive (09/03/2021)   Exercise Vital Sign    Days of Exercise per Week: 0 days    Minutes of Exercise per Session: 0 min  Stress: No Stress Concern Present (09/03/2021)   [employer] of Occupational Health - Occupational Stress Questionnaire    Feeling  of Stress : Not at all  Social Connections: Moderately Integrated (09/03/2021)   Social Connection and Isolation Panel    Frequency of Communication with Friends and Family: More than three times a week    Frequency of Social Gatherings with Friends and Family: More than three times a week    Attends Religious Services: More than 4 times per year    Active Member of [employer] or Organizations: No    Attends Banker Meetings: Never    Marital Status: Living with partner  Intimate Partner Violence: Not At Risk (02/15/2024)   Humiliation, Afraid, Rape, and Kick questionnaire    Fear of Current or Ex-Partner: No    Emotionally Abused: No    Physically Abused: No    Sexually Abused: No    Review of Systems  All other  systems reviewed and are negative.       Objective    BP 128/89   Pulse 83   Ht 5' 5 (1.651 m)   Wt 188 lb 9.6 oz (85.5 kg)   LMP 02/13/2024 (Approximate)   SpO2 97%   BMI 31.38 kg/m   Physical Exam Vitals and nursing note reviewed.  Constitutional:      General: She is not in acute distress. Cardiovascular:     Rate and Rhythm: Normal rate and regular rhythm.  Pulmonary:     Effort: Pulmonary effort is normal.     Breath sounds: Normal breath sounds.  Abdominal:     Palpations: Abdomen is soft.     Tenderness: There is no abdominal tenderness.  Neurological:     General: No focal deficit present.     Mental Status: She is alert and oriented to person, place, and time.         Assessment & Plan:   Screen for STD (sexually transmitted disease) -     Cervicovaginal ancillary only  Vaginal discharge     No follow-ups on file.   Tanda Raguel SQUIBB, MD

## 2024-05-31 ENCOUNTER — Ambulatory Visit: Payer: Self-pay | Admitting: Family Medicine

## 2024-05-31 ENCOUNTER — Other Ambulatory Visit: Payer: Self-pay | Admitting: *Deleted

## 2024-05-31 LAB — CERVICOVAGINAL ANCILLARY ONLY
Bacterial Vaginitis (gardnerella): POSITIVE — AB
Candida Glabrata: NEGATIVE
Candida Vaginitis: POSITIVE — AB
Chlamydia: NEGATIVE
Comment: NEGATIVE
Comment: NEGATIVE
Comment: NEGATIVE
Comment: NEGATIVE
Comment: NEGATIVE
Comment: NORMAL
Neisseria Gonorrhea: NEGATIVE
Trichomonas: NEGATIVE

## 2024-05-31 MED ORDER — FLUCONAZOLE 150 MG PO TABS: 150.0000 mg | ORAL_TABLET | Freq: Once | ORAL | 0 refills | Status: DC

## 2024-05-31 MED ORDER — FLUCONAZOLE 150 MG PO TABS: 150.0000 mg | ORAL_TABLET | Freq: Once | ORAL | 0 refills | Status: AC

## 2024-05-31 MED ORDER — METRONIDAZOLE 500 MG PO TABS
500.0000 mg | ORAL_TABLET | Freq: Two times a day (BID) | ORAL | 0 refills | Status: DC
Start: 2024-05-31 — End: 2024-05-31

## 2024-05-31 MED ORDER — METRONIDAZOLE 500 MG PO TABS: 500.0000 mg | ORAL_TABLET | Freq: Two times a day (BID) | ORAL | 0 refills | Status: DC

## 2024-05-31 NOTE — Progress Notes (Signed)
 Resent to new pharmacy

## 2024-06-02 ENCOUNTER — Ambulatory Visit (INDEPENDENT_AMBULATORY_CARE_PROVIDER_SITE_OTHER): Admitting: Primary Care

## 2024-07-10 ENCOUNTER — Ambulatory Visit (INDEPENDENT_AMBULATORY_CARE_PROVIDER_SITE_OTHER): Admitting: Primary Care

## 2024-07-11 ENCOUNTER — Encounter: Payer: Self-pay | Admitting: Nurse Practitioner

## 2024-07-11 ENCOUNTER — Ambulatory Visit (INDEPENDENT_AMBULATORY_CARE_PROVIDER_SITE_OTHER): Payer: Self-pay | Admitting: Nurse Practitioner

## 2024-07-11 VITALS — BP 123/79 | HR 83 | Temp 97.3°F | Wt 180.0 lb

## 2024-07-11 DIAGNOSIS — N898 Other specified noninflammatory disorders of vagina: Secondary | ICD-10-CM | POA: Insufficient documentation

## 2024-07-11 MED ORDER — METRONIDAZOLE 500 MG PO TABS: 500.0000 mg | ORAL_TABLET | Freq: Two times a day (BID) | ORAL | 0 refills | Status: AC

## 2024-07-11 MED ORDER — FLUCONAZOLE 150 MG PO TABS: 150.0000 mg | ORAL_TABLET | Freq: Once | ORAL | 0 refills | Status: AC

## 2024-07-11 NOTE — Patient Instructions (Signed)
.   Acute vaginitis  - metroNIDAZOLE  (FLAGYL ) 500 MG tablet; Take 1 tablet (500 mg total) by mouth 2 (two) times daily for 7 days.  Dispense: 14 tablet; Refill: 0 - fluconazole  (DIFLUCAN ) 150 MG tablet; Take 1 tablet (150 mg total) by mouth once for 1 dose.  Dispense: 1 tablet; Refill: 0   Healthy vaginal hygiene practices   - Avoid sleeper pajamas. Nightgowns allow air to circulate.  Sleep without underpants whenever possible.  - Wear cotton underpants during the day. Double-rinse underwear after washing to avoid residual irritants. Do not use fabric softeners for underwear and swimsuits.  - Avoid tights, leotards, leggings, skinny jeans, and other tight-fitting clothing. Skirts and loose-fitting pants allow air to circulate.  - Avoid pantyliners.  Instead use tampons or cotton pads.  - Daily warm bathing is helpful:     - Soak in clean water (no soap) for 10 to 15 minutes. Adding vinegar or baking soda to the water has not been specifically studied and may not be better than clean water alone.      - Use soap to wash regions other than the genital area just before getting out of the tub. Limit use of any soap on genital areas. Use fragance-free soaps.     - Rinse the genital area well and gently pat dry.  Don't rub.  Hair dryer to assist with drying can be used only if on cool setting.     - Do not use bubble baths or perfumed soaps.  - Do not use any feminine sprays, douches or powders.  These contain chemicals that will irritate the skin.  - If the genital area is tender or swollen, cool compresses may relieve the discomfort. Unscented wet wipes can be used instead of toilet paper for wiping.   - Emollients, such as Vaseline, may help protect skin and can be applied to the irritated area.  - Always remember to wipe front-to-back after bowel movements. Pat dry after urination.  - Do not sit in wet swimsuits for long periods of time after swimming

## 2024-07-11 NOTE — Progress Notes (Signed)
 Established Patient Office Visit  Subjective:  Patient ID: Meagan Skinner, female    DOB: 08-22-1991  Age: 33 y.o. MRN: [id number]  CC:  Chief Complaint  Patient presents with   Vaginitis    ronic medical conditions.  Discussed the use of AI scribe software for clinical note transcription with the patient, who gave verbal consent to proceed.  History of Present Illness Meagan Skinner is a 33 year old female  has a past medical history of Angio-edema, Depression, Dysmenorrhea, Headache, Herpes, Infection, STD (sexually transmitted disease), and Urticaria.  who presents with recurrent symptoms of yeast infection and bacterial vaginosis.  She is experiencing symptoms similar to a previous episode of yeast infection and bacterial vaginosis that occurred approximately three and a half weeks to a month ago. At that time, she was prescribed Flagyl  and Diflucan , which she completed, resulting in symptom improvement.  Currently, she is experiencing a recurrence of symptoms including discharge and odor, but no itching or burning. The discharge is described as 'milky white'. She mentions that her symptoms are exactly the same as the previous episode.  No other concerns apart from the symptoms related to the yeast infection and bacterial vaginosis.   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   Urticaria     Past Surgical History:  Procedure Laterality Date   MULTIPLE TOOTH EXTRACTIONS      Family History  Problem Relation Age of Onset   Diabetes Maternal Grandmother    Crohn's disease Maternal Grandmother    Fibromyalgia Maternal Grandmother    Heart disease Maternal Grandmother    Asthma Maternal Grandmother    Stroke  Maternal Grandmother    Hypertension Maternal Grandmother    Rheum arthritis Maternal Grandmother    Cancer Maternal Grandfather    Asthma Sister    Heart disease Sister    Asthma Brother    Healthy Son     Social History   Socioeconomic History   Marital status: Single    Spouse name: Not on file   Number of children: Not on file   Years of education: Not on file   Highest education level: Not on file  Occupational History   Not on file  Tobacco Use   Smoking status: Never   Smokeless tobacco: Never  Vaping Use   Vaping status: Never Used  Substance and Sexual Activity   Alcohol use: Yes    Alcohol/week: 2.0 standard drinks of alcohol    Types: 2 Glasses of wine per week    Comment: socially   Drug use: No   Sexual activity: Not Currently    Birth control/protection: Condom  Other Topics Concern   Not on file  Social History Narrative   Not on file   Social Drivers of Health   Financial Resource Strain: Low Risk  (09/03/2021)   Overall Financial Resource Strain (CARDIA)    Difficulty of Paying Living Expenses: Not hard at all  Food Insecurity: No Food Insecurity (02/15/2024)   Hunger Vital Sign    Worried About Running Out of Food in the Last Year: Never true    Ran Out of Food in the Last  Year: Never true  Transportation Needs: No Transportation Needs (02/15/2024)   PRAPARE - Administrator, Civil Service (Medical): No    Lack of Transportation (Non-Medical): No  Physical Activity: Inactive (09/03/2021)   Exercise Vital Sign    Days of Exercise per Week: 0 days    Minutes of Exercise per Session: 0 min  Stress: No Stress Concern Present (09/03/2021)   [employer] of Occupational Health - Occupational Stress Questionnaire    Feeling of Stress : Not at all  Social Connections: Moderately Integrated (09/03/2021)   Social Connection and Isolation Panel    Frequency of Communication with Friends and Family: More than three times a week    Frequency  of Social Gatherings with Friends and Family: More than three times a week    Attends Religious Services: More than 4 times per year    Active Member of [employer] or Organizations: No    Attends Banker Meetings: Never    Marital Status: Living with partner  Intimate Partner Violence: Not At Risk (02/15/2024)   Humiliation, Afraid, Rape, and Kick questionnaire    Fear of Current or Ex-Partner: No    Emotionally Abused: No    Physically Abused: No    Sexually Abused: No    Outpatient Medications Prior to Visit  Medication Sig Dispense Refill   ibuprofen  (ADVIL ) 600 MG tablet Take 1 tablet (600 mg total) by mouth every 8 (eight) hours as needed. 90 tablet 1   promethazine -dextromethorphan (PROMETHAZINE -DM) 6.25-15 MG/5ML syrup Take 5 mLs by mouth at bedtime as needed for cough. (Patient not taking: Reported on 07/11/2024) 118 mL 0   Facility-Administered Medications Prior to Visit  Medication Dose Route Frequency Provider Last Rate Last Admin   medroxyPROGESTERone  (DEPO-PROVERA ) injection 150 mg  150 mg Intramuscular Once Celestia Rosaline SQUIBB, NP        Allergies  Allergen Reactions   Banana Swelling and Rash   Pineapple Swelling and Rash    ROS Review of Systems  Constitutional:  Negative for appetite change, chills, fatigue and fever.  HENT:  Negative for congestion, postnasal drip, rhinorrhea and sneezing.   Respiratory:  Negative for cough, shortness of breath and wheezing.   Cardiovascular:  Negative for chest pain, palpitations and leg swelling.  Gastrointestinal:  Negative for abdominal pain, constipation, nausea and vomiting.  Genitourinary:  Positive for vaginal discharge. Negative for difficulty urinating, dysuria, flank pain and frequency.  Musculoskeletal:  Negative for arthralgias, back pain, joint swelling and myalgias.  Skin:  Negative for color change, pallor, rash and wound.  Neurological:  Negative for dizziness, facial asymmetry, weakness, numbness and  headaches.  Psychiatric/Behavioral:  Negative for behavioral problems, confusion, self-injury and suicidal ideas.       Objective:    Physical Exam Vitals and nursing note reviewed.  Constitutional:      General: She is not in acute distress.    Appearance: Normal appearance. She is not ill-appearing, toxic-appearing or diaphoretic.  Eyes:     General: No scleral icterus.       Right eye: No discharge.        Left eye: No discharge.     Extraocular Movements: Extraocular movements intact.     Conjunctiva/sclera: Conjunctivae normal.  Cardiovascular:     Rate and Rhythm: Normal rate and regular rhythm.     Pulses: Normal pulses.     Heart sounds: Normal heart sounds. No murmur heard.    No friction rub. No gallop.  Pulmonary:  Effort: Pulmonary effort is normal. No respiratory distress.     Breath sounds: Normal breath sounds. No stridor. No wheezing, rhonchi or rales.  Chest:     Chest wall: No tenderness.  Abdominal:     General: There is no distension.     Palpations: Abdomen is soft.     Tenderness: There is no abdominal tenderness. There is no right CVA tenderness, left CVA tenderness or guarding.  Musculoskeletal:        General: No swelling, tenderness, deformity or signs of injury.     Right lower leg: No edema.     Left lower leg: No edema.  Skin:    General: Skin is warm and dry.     Capillary Refill: Capillary refill takes 2 to 3 seconds.     Coloration: Skin is not jaundiced or pale.     Findings: No bruising, erythema or lesion.  Neurological:     Mental Status: She is alert and oriented to person, place, and time.     Motor: No weakness.     Gait: Gait normal.  Psychiatric:        Mood and Affect: Mood normal.        Behavior: Behavior normal.        Thought Content: Thought content normal.        Judgment: Judgment normal.     BP 123/79   Pulse 83   Temp (!) 97.3 F (36.3 C)   Wt 180 lb (81.6 kg)   SpO2 100%   BMI 29.95 kg/m  Wt Readings  from Last 3 Encounters:  07/11/24 180 lb (81.6 kg)  05/30/24 188 lb 9.6 oz (85.5 kg)  02/15/24 176 lb 3.2 oz (79.9 kg)    Lab Results  Component Value Date   TSH 0.814 12/28/2019   Lab Results  Component Value Date   WBC 6.0 12/08/2022   HGB 13.2 12/08/2022   HCT 40.9 12/08/2022   MCV 88 12/08/2022   PLT 376 12/08/2022   Lab Results  Component Value Date   NA 139 12/08/2022   K 4.5 12/08/2022   CO2 22 12/08/2022   GLUCOSE 83 12/08/2022   BUN 8 12/08/2022   CREATININE 0.72 12/08/2022   BILITOT 0.8 12/08/2022   ALKPHOS 51 12/08/2022   AST 17 12/08/2022   ALT 28 12/08/2022   PROT 7.2 12/08/2022   ALBUMIN 4.6 12/08/2022   CALCIUM  9.2 12/08/2022   ANIONGAP 9 10/12/2018   EGFR 115 12/08/2022   Lab Results  Component Value Date   CHOL 137 12/08/2022   Lab Results  Component Value Date   HDL 63 12/08/2022   Lab Results  Component Value Date   LDLCALC 53 12/08/2022   Lab Results  Component Value Date   TRIG 117 12/08/2022   Lab Results  Component Value Date   CHOLHDL 2.2 12/08/2022   Lab Results  Component Value Date   HGBA1C 5.6 12/08/2022      Assessment & Plan:   Problem List Items Addressed This Visit       Other   Vaginal discharge - Primary   Will treat for suspected bacterial vaginosis and vulvovaginal candidiasis Unable to do labs today Symptoms of milky white discharge and odor indicate recurrence. Previous treatment with Flagyl  and Diflucan  was effective. - Refill prescription for Flagyl  and Diflucan . - Advised wearing a gown at night to promote air circulation. - Recommended wearing cotton undergarments. - Advised avoiding tight clothing around the waist.  Relevant Medications   metroNIDAZOLE  (FLAGYL ) 500 MG tablet   fluconazole  (DIFLUCAN ) 150 MG tablet    Meds ordered this encounter  Medications   metroNIDAZOLE  (FLAGYL ) 500 MG tablet    Sig: Take 1 tablet (500 mg total) by mouth 2 (two) times daily for 7 days.    Dispense:   14 tablet    Refill:  0   fluconazole  (DIFLUCAN ) 150 MG tablet    Sig: Take 1 tablet (150 mg total) by mouth once for 1 dose.    Dispense:  1 tablet    Refill:  0    Follow-up: Return in about 6 weeks (around 08/22/2024) for CPE.    Shanti Agresti R Tami Barren, FNP

## 2024-07-11 NOTE — Assessment & Plan Note (Addendum)
 Will treat for suspected bacterial vaginosis and vulvovaginal candidiasis Unable to do labs today Symptoms of milky white discharge and odor indicate recurrence. Previous treatment with Flagyl  and Diflucan  was effective. - Refill prescription for Flagyl  and Diflucan . - Advised wearing a gown at night to promote air circulation. - Recommended wearing cotton undergarments. - Advised avoiding tight clothing around the waist.

## 2024-07-26 ENCOUNTER — Telehealth (INDEPENDENT_AMBULATORY_CARE_PROVIDER_SITE_OTHER): Payer: Self-pay | Admitting: Primary Care

## 2024-07-26 NOTE — Telephone Encounter (Signed)
 Called pt to schedule appt. Pt did not answer but if pt does call back please schedule appt or call office and I will schedule.

## 2024-10-02 ENCOUNTER — Encounter: Payer: Self-pay | Admitting: Nurse Practitioner

## 2024-12-01 ENCOUNTER — Ambulatory Visit: Payer: Self-pay | Admitting: Radiology

## 2024-12-01 ENCOUNTER — Other Ambulatory Visit: Payer: Self-pay

## 2024-12-01 ENCOUNTER — Ambulatory Visit
Admission: EM | Admit: 2024-12-01 | Discharge: 2024-12-01 | Disposition: A | Discharge status: Home / Self Care | Payer: Self-pay | Source: Home / Self Care

## 2024-12-01 DIAGNOSIS — S93601A Unspecified sprain of right foot, initial encounter: Secondary | ICD-10-CM

## 2024-12-01 DIAGNOSIS — S92251A Displaced fracture of navicular [scaphoid] of right foot, initial encounter for closed fracture: Secondary | ICD-10-CM

## 2024-12-01 DIAGNOSIS — M79671 Pain in right foot: Secondary | ICD-10-CM

## 2024-12-01 NOTE — Discharge Instructions (Addendum)
 You were seen today for concerns of right foot pain that occurred after a fall.  Your x-ray shows a minimally displaced fracture of the navicular bone on your right foot.  To assist with stabilizing your foot we have placed you in a cam boot.  I recommend wearing this when you are up and moving and if you feel like you can keep your foot stabilized you can take it off at night.  Please follow-up with orthopedics within the week so that they can monitor and make sure that you are recovering appropriately.  If you start to have severe pain, swelling of the foot, discoloration in your toes, numbness, decreased pulses please go to the ER. You can alternate Tylenol  and ibuprofen  as needed for pain, cool compresses for the first 48 hours and warm compresses after that should help with pain.  I also recommend warm Epsom salt foot soaks for further pain relief as desired.

## 2024-12-01 NOTE — ED Provider Notes (Signed)
 " [facility]    CSN: [id number] Arrival date & time: 12/01/24  1002      History   Chief Complaint Chief Complaint  Patient presents with   Fall   Foot Pain    HPI Meagan Skinner is a 34 y.o. female.   HPI  Pt is here today with concerns of a fall that occurred earlier this AM. She states she slipped on ice and fell on her backside. She reports that she twisted her right foot and thinks it may have been under her. She denies hitting her head, LOC. She reports her pain is mostly localized in her right foot along the dorsum and radiating into the big toe.  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   Urticaria     Patient Active Problem List   Diagnosis Date Noted   Vaginal discharge 07/11/2024   Angioedema 10/29/2020   Urticaria 09/26/2020   Normal labor 10/09/2016   Premature cervical dilation in third trimester 08/31/2016    Past Surgical History:  Procedure Laterality Date   MULTIPLE TOOTH EXTRACTIONS      OB History     Gravida  2   Para  1   Term  1   Preterm      AB  1   Living  1      SAB  0   IAB  1   Ectopic      Multiple  0   Live Births  1            Home Medications    Prior to Admission medications  Medication Sig Start Date End Date Taking? Authorizing Provider  ibuprofen  (ADVIL ) 600 MG tablet Take 1 tablet (600 mg total) by mouth every 8 (eight) hours as needed. 08/26/23   Celestia Rosaline SQUIBB, NP  promethazine -dextromethorphan (PROMETHAZINE -DM) 6.25-15 MG/5ML syrup Take 5 mLs by mouth at bedtime as needed for cough. Patient not taking: No sig reported 01/04/24   Enedelia Dorna HERO, FNP    Family History Family History  Problem  Relation Age of Onset   Asthma Sister    Heart disease Sister    Asthma Brother    Diabetes Maternal Grandmother    Crohn's disease Maternal Grandmother    Fibromyalgia Maternal Grandmother    Heart disease Maternal Grandmother    Asthma Maternal Grandmother    Stroke Maternal Grandmother    Hypertension Maternal Grandmother    Rheum arthritis Maternal Grandmother    Cancer Maternal Grandfather    Healthy Son     Social History Social History[1]   Allergies   Banana and Pineapple   Review of Systems Review of Systems  Musculoskeletal:        Right foot pain.      Physical Exam Triage Vital Signs ED Triage Vitals  Encounter Vitals Group     BP 12/01/24 1038 118/81     Girls Systolic BP Percentile --      Girls Diastolic BP Percentile --      Boys Systolic BP Percentile --      Boys Diastolic BP Percentile --  Pulse Rate 12/01/24 1038 78     Resp 12/01/24 1038 18     Temp 12/01/24 1038 98.4 F (36.9 C)     Temp Source 12/01/24 1038 Oral     SpO2 12/01/24 1038 96 %     Weight --      Height --      Head Circumference --      Peak Flow --      Pain Score 12/01/24 1036 9     Pain Loc --      Pain Education --      Exclude from Growth Chart --    No data found.  Updated Vital Signs BP 118/81 (BP Location: Right Arm)   Pulse 78   Temp 98.4 F (36.9 C) (Oral)   Resp 18   LMP 11/18/2024 (Within Weeks)   SpO2 96%   Visual Acuity Right Eye Distance:   Left Eye Distance:   Bilateral Distance:    Right Eye Near:   Left Eye Near:    Bilateral Near:     Physical Exam Vitals reviewed.  Constitutional:      General: She is awake.     Appearance: Normal appearance. She is well-developed and well-groomed.  HENT:     Head: Normocephalic and atraumatic.  Eyes:     General: Lids are normal. Gaze aligned appropriately.     Extraocular Movements: Extraocular movements intact.     Conjunctiva/sclera: Conjunctivae normal.  Cardiovascular:     Pulses:           Dorsalis pedis pulses are 2+ on the right side.       Posterior tibial pulses are 2+ on the right side.  Pulmonary:     Effort: Pulmonary effort is normal.  Musculoskeletal:     Right ankle: No tenderness. Normal range of motion. Anterior drawer test negative. Normal pulse.     Right Achilles Tendon: Normal. No tenderness or defects. Thompson's test negative.     Right foot: Normal range of motion and normal capillary refill. Swelling and tenderness present. No deformity or laceration. Normal pulse.  Feet:     Right foot:     Skin integrity: Dry skin present.     Toenail Condition: Right toenails are normal.  Neurological:     Mental Status: She is alert and oriented to person, place, and time.  Psychiatric:        Attention and Perception: Attention and perception normal.        Mood and Affect: Mood and affect normal.        Speech: Speech normal.        Behavior: Behavior normal. Behavior is cooperative.      UC Treatments / Results  Labs (all labs ordered are listed, but only abnormal results are displayed) Labs Reviewed - No data to display  EKG   Radiology DG Foot Complete Right Result Date: 12/01/2024 CLINICAL DATA:  Right foot pain after fall today EXAM: RIGHT FOOT COMPLETE - 3+ VIEW COMPARISON:  None Available. FINDINGS: Minimally displaced fracture is seen involving superior aspect of navicular bone on lateral projection. No dislocation is noted. Joint spaces are unremarkable. IMPRESSION: Minimally displaced fracture involving superior aspect of navicular bone. Electronically Signed   By: Lynwood Landy Raddle M.D.   On: 12/01/2024 11:22    Procedures Procedures (including critical care time)  Medications Ordered in UC Medications - No data to display  Initial Impression / Assessment and Plan / UC Course  I  have reviewed the triage vital signs and the nursing notes.  Pertinent labs & imaging results that were available during my care of the patient were  reviewed by me and considered in my medical decision making (see chart for details).      Final Clinical Impressions(s) / UC Diagnoses   Final diagnoses:  Right foot pain  Sprain of right foot, initial encounter  Closed displaced fracture of navicular bone of right foot, initial encounter   Patient presents today with concerns for foot injury that occurred during slipping and falling on ice earlier this morning.  She reports pain along the dorsum of the foot.  Physical exam is notable for mild swelling and slight bruising as well as severe tenderness to palpation over the dorsum of the foot.  Imaging is notable for a minimally displaced fracture involving superior aspect of the navicular bone.  Reviewed imaging results with patient during her visit.  Patient was placed in a cam boot for stabilization until she is seen and evaluated by orthopedics for further management.  Recommend alternating Tylenol  and ibuprofen  as needed for pain control.  Reviewed home measures which can provide further relief until cleared by Ortho.  Follow-up as indicated.    Discharge Instructions      You were seen today for concerns of right foot pain that occurred after a fall.  Your x-ray shows a minimally displaced fracture of the navicular bone on your right foot.  To assist with stabilizing your foot we have placed you in a cam boot.  I recommend wearing this when you are up and moving and if you feel like you can keep your foot stabilized you can take it off at night.  Please follow-up with orthopedics within the week so that they can monitor and make sure that you are recovering appropriately.  If you start to have severe pain, swelling of the foot, discoloration in your toes, numbness, decreased pulses please go to the ER. You can alternate Tylenol  and ibuprofen  as needed for pain, cool compresses for the first 48 hours and warm compresses after that should help with pain.  I also recommend warm Epsom salt foot  soaks for further pain relief as desired.     ED Prescriptions   None    PDMP not reviewed this encounter.     [1]  Social History Tobacco Use   Smoking status: Never   Smokeless tobacco: Never  Vaping Use   Vaping status: Never Used  Substance Use Topics   Alcohol use: Yes    Alcohol/week: 2.0 standard drinks of alcohol    Types: 2 Glasses of wine per week    Comment: socially   Drug use: No     Elvie Maines, [place] BRAVO, PA-C 12/01/24 1338  "

## 2024-12-01 NOTE — ED Triage Notes (Signed)
 Pt presents to urgent care following fall this AM. Occurred at approximately 0915 this morning outside of her apartment complex. Fell directly on posterior back. Denies hitting head. Voicing pain on top of right foot. Rates overall R foot pain a 9/10. No medications taken PTA. Limping to triage room.

## 2024-12-04 ENCOUNTER — Ambulatory Visit (HOSPITAL_COMMUNITY): Payer: Self-pay | Admitting: Clinical
